# Patient Record
Sex: Male | Born: 1960 | Race: White | Hispanic: No | Smoking: Current every day smoker
Health system: Southern US, Community
[De-identification: ages and names within clinical notes are randomized; demographics above are authoritative.]

## PROBLEM LIST (undated history)

## (undated) DIAGNOSIS — J449 Chronic obstructive pulmonary disease, unspecified: Secondary | ICD-10-CM

## (undated) DIAGNOSIS — M542 Cervicalgia: Secondary | ICD-10-CM

## (undated) DIAGNOSIS — G8929 Other chronic pain: Secondary | ICD-10-CM

## (undated) DIAGNOSIS — M1990 Unspecified osteoarthritis, unspecified site: Secondary | ICD-10-CM

## (undated) DIAGNOSIS — Z981 Arthrodesis status: Secondary | ICD-10-CM

## (undated) DIAGNOSIS — M549 Dorsalgia, unspecified: Secondary | ICD-10-CM

---

## 2008-07-02 HISTORY — PX: NECK SURGERY: SHX720

## 2009-03-02 HISTORY — PX: BACK SURGERY: SHX140

## 2012-10-14 ENCOUNTER — Emergency Department (HOSPITAL_COMMUNITY): Payer: Medicare Other

## 2012-10-14 ENCOUNTER — Encounter (HOSPITAL_COMMUNITY): Payer: Self-pay | Admitting: Emergency Medicine

## 2012-10-14 ENCOUNTER — Emergency Department (HOSPITAL_COMMUNITY)
Admission: EM | Admit: 2012-10-14 | Discharge: 2012-10-14 | Disposition: A | Discharge status: Home / Self Care | Payer: Medicare Other | Attending: Emergency Medicine | Admitting: Emergency Medicine

## 2012-10-14 DIAGNOSIS — Y929 Unspecified place or not applicable: Secondary | ICD-10-CM | POA: Insufficient documentation

## 2012-10-14 DIAGNOSIS — S161XXA Strain of muscle, fascia and tendon at neck level, initial encounter: Secondary | ICD-10-CM

## 2012-10-14 DIAGNOSIS — G8929 Other chronic pain: Secondary | ICD-10-CM

## 2012-10-14 DIAGNOSIS — Z9889 Other specified postprocedural states: Secondary | ICD-10-CM | POA: Insufficient documentation

## 2012-10-14 DIAGNOSIS — F17200 Nicotine dependence, unspecified, uncomplicated: Secondary | ICD-10-CM | POA: Insufficient documentation

## 2012-10-14 DIAGNOSIS — R05 Cough: Secondary | ICD-10-CM | POA: Insufficient documentation

## 2012-10-14 DIAGNOSIS — R059 Cough, unspecified: Secondary | ICD-10-CM | POA: Insufficient documentation

## 2012-10-14 DIAGNOSIS — S139XXA Sprain of joints and ligaments of unspecified parts of neck, initial encounter: Secondary | ICD-10-CM | POA: Insufficient documentation

## 2012-10-14 DIAGNOSIS — Z880 Allergy status to penicillin: Secondary | ICD-10-CM | POA: Insufficient documentation

## 2012-10-14 DIAGNOSIS — Y9389 Activity, other specified: Secondary | ICD-10-CM | POA: Insufficient documentation

## 2012-10-14 DIAGNOSIS — X500XXA Overexertion from strenuous movement or load, initial encounter: Secondary | ICD-10-CM | POA: Insufficient documentation

## 2012-10-14 DIAGNOSIS — S0993XA Unspecified injury of face, initial encounter: Secondary | ICD-10-CM | POA: Insufficient documentation

## 2012-10-14 DIAGNOSIS — K5900 Constipation, unspecified: Secondary | ICD-10-CM | POA: Insufficient documentation

## 2012-10-14 HISTORY — DX: Cervicalgia: M54.2

## 2012-10-14 HISTORY — DX: Other chronic pain: G89.29

## 2012-10-14 HISTORY — DX: Dorsalgia, unspecified: M54.9

## 2012-10-14 MED ORDER — CYCLOBENZAPRINE HCL 10 MG PO TABS
10.0000 mg | ORAL_TABLET | Freq: Once | ORAL | Status: AC
  Administered 2012-10-14: 10 mg via ORAL
  Filled 2012-10-14: qty 1

## 2012-10-14 MED ORDER — NAPROXEN 500 MG PO TABS: 500.0000 mg | ORAL_TABLET | Freq: Two times a day (BID) | ORAL | Status: DC

## 2012-10-14 MED ORDER — IBUPROFEN 800 MG PO TABS
800.0000 mg | ORAL_TABLET | Freq: Once | ORAL | Status: AC
  Administered 2012-10-14: 800 mg via ORAL
  Filled 2012-10-14: qty 1

## 2012-10-14 MED ORDER — OXYCODONE-ACETAMINOPHEN 5-325 MG PO TABS
1.0000 | ORAL_TABLET | Freq: Once | ORAL | Status: AC
  Administered 2012-10-14: 1 via ORAL
  Filled 2012-10-14: qty 1

## 2012-10-14 MED ORDER — CYCLOBENZAPRINE HCL 10 MG PO TABS: 10.0000 mg | ORAL_TABLET | Freq: Two times a day (BID) | ORAL | Status: DC | PRN

## 2012-10-14 NOTE — ED Notes (Signed)
Pt reports having chronic neck and back.  Had surgery July 2011/2012. Moved to [place] 1 year ago and "deals with the pain daily", unable to find a doctor to see him.  Has been having tremors since surgery. Turned head on Thursday and since pain has been severe.

## 2012-10-14 NOTE — ED Provider Notes (Signed)
CSN: [id number]     Arrival date & time 10/14/12  0907 History   First MD Initiated Contact with Patient 10/14/12 1039     Chief Complaint  Patient presents with  . Back Pain  . Neck Pain   (Consider location/radiation/quality/duration/timing/severity/associated sxs/prior Treatment) Patient is a 52 y.o. male presenting with back pain and neck pain. The history is provided by the patient.  Back Pain Quality:  Burning and shooting Radiates to:  R posterior upper leg Pain severity:  Severe Pain is:  Same all the time Onset quality:  Gradual Duration:  5 days Timing:  Constant Progression:  Worsening Chronicity:  Chronic Relieved by:  Nothing Worsened by:  Movement, standing, bending and ambulation Ineffective treatments:  None tried Associated symptoms: no abdominal pain, no chest pain, no dysuria, no fever and no headaches   Neck Pain Associated symptoms: no chest pain, no fever and no headaches   No loss of control of bladder or bowels.  Miguel Gutierrez is a 52 y.o. male who presents to the ED with chronic neck and back pain. He had neck surgery in 2011 and lower back surgery 2012. He moved here from [place] one year ago and has been unable to find a doctor that will accept him. He has been smoking marijuana to help deal with his pain. Last week he turned his neck and felt a pop and had severe pain. He pain has been unbearable since then.   Past Medical History  Diagnosis Date  . Chronic neck pain   . Chronic back pain    Past Surgical History  Procedure Laterality Date  . Back surgery    . Neck surgery     No family history on file. History  Substance Use Topics  . Smoking status: Current Every Day Smoker    Types: Cigarettes  . Smokeless tobacco: Not on file  . Alcohol Use: Yes     Comment: occ    Review of Systems  Constitutional: Negative for fever and chills.  HENT: Negative for congestion and ear pain.   Eyes: Negative for visual disturbance.  Respiratory:  Positive for cough. Negative for shortness of breath.   Cardiovascular: Negative for chest pain and palpitations.  Gastrointestinal: Positive for constipation. Negative for nausea, vomiting and abdominal pain.  Genitourinary: Negative for dysuria, urgency and frequency.  Musculoskeletal: Positive for back pain and neck pain.  Skin: Negative for rash.  Neurological: Negative for dizziness and headaches.  Psychiatric/Behavioral: The patient is not nervous/anxious.     Allergies  Penicillins; Spinach; and Sulfa antibiotics  Home Medications   Current Outpatient Rx  Name  Route  Sig  Dispense  Refill  . oxymetazoline (AFRIN) 0.05 % nasal spray   Nasal   Place 2 sprays into the nose daily as needed for congestion.          BP 103/76  Pulse 88  Temp(Src) 97.7 F (36.5 C) (Oral)  Resp 20  Ht 5\' 11"  (1.803 m)  Wt 150 lb (68.04 kg)  BMI 20.93 kg/m2  SpO2 100% Physical Exam  Nursing note and vitals reviewed. Constitutional: He is oriented to person, place, and time. He appears well-developed and well-nourished.  HENT:  Head: Normocephalic and atraumatic.  Eyes: Conjunctivae and EOM are normal.  Neck: Spinous process tenderness and muscular tenderness present. Decreased range of motion present.  Cardiovascular: Normal rate and regular rhythm.   Pulmonary/Chest: Effort normal.  Abdominal: Soft. There is no tenderness.  Musculoskeletal:  Lumbar back: He exhibits decreased range of motion, tenderness and spasm. He exhibits no deformity and normal pulse.  Neurological: He is alert and oriented to person, place, and time. He has normal strength and normal reflexes. No cranial nerve deficit. Gait normal.  Skin: Skin is warm and dry.  Psychiatric: He has a normal mood and affect. His behavior is normal.   Mr Cervical Spine Wo Contrast  10/14/2012   CLINICAL DATA:  Neck pain and weakness.  EXAM: MRI CERVICAL SPINE WITHOUT CONTRAST  TECHNIQUE: Multiplanar, multisequence MR imaging  was performed. No intravenous contrast was administered.  COMPARISON:  None.  FINDINGS: Moderate degenerative cervical spondylosis with multilevel disk disease and facet disease. Extensive postoperative changes with wide decompressive laminectomies at C3-4, C4-5 and C5-6. Posterior fusion changes are noted without complicating features. The vertebral bodies demonstrate normal marrow signal except for endplate reactive changes. The cervical spinal cord demonstrates a small focus of increased T2 signal intensity at the C4 level which may be related to prior trauma or compression.  C2-3: Mild annular bulge and osteophytic ridging but no significant spinal stenosis. Minimal foraminal encroachment bilaterally, right greater than left.  C3-4:  Moderate artifact. No obvious spinal or foraminal stenosis.  C4-5 posterior fusion changes. No spinal or foraminal stenosis.  C5-6: Postoperative changes. No spinal stenosis. Uncinate spurring changes bilaterally with mild bilateral foraminal stenosis.  C6-7: Broad-based bulging annulus and moderate facet disease but no significant spinal or foraminal stenosis. There is slight flattening of the ventral thecal sac and suspect mild cord signal abnormality possibly reflecting myelomalacia.  C7-T1: Mild bulging annulus. No spinal or foraminal stenosis.  IMPRESSION: 1. Postoperative changes of wide decompressive laminectomies and posterior fusion changes from C3 to C6. 2. Uncinate spurring changes bilaterally at C5-6 with mild bilateral foraminal stenosis. 3. Probable remote cord signal changes at C4. 4. 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 No significant spinal stenosis at this level.   Electronically Signed   By: Loralie Champagne M.D.   On: 10/14/2012 12:51    ED Course: discussed with EDP, will treat patient's pain and have him follow up with ortho or pain management.  Procedures EKG Interpretation   None       MDM  52 y.o. male with chronic  pain. exacerbation of neck pain after turning neck to the side 5 days ago. Will treat for pain  And muscle spasm. Patient to follow up with Dr. Cornell Barman for pain management.  Patient stable for discharge home without any immediate complications. I have reviewed this patient's vital signs, nurses notes, appropriate labs and imaging.  I have discussed findings and plan of care with the patient and he voices understanding.    Medication List    TAKE these medications       cyclobenzaprine 10 MG tablet  Commonly known as:  FLEXERIL  Take 1 tablet (10 mg total) by mouth 2 (two) times daily as needed for muscle spasms.     naproxen 500 MG tablet  Commonly known as:  NAPROSYN  Take 1 tablet (500 mg total) by mouth 2 (two) times daily.      ASK your doctor about these medications       oxymetazoline 0.05 % nasal spray  Commonly known as:  AFRIN  Place 2 sprays into the nose daily as needed for congestion.           Janne Napoleon, [place] 10/15/12 [phone]

## 2012-10-14 NOTE — Progress Notes (Signed)
ED/CM noted patient did not have a PCP listed in the computer.  Patient was given the [facility] with information on the new MDs who are taking new patients, clinics, food pantries, and the handout for new health insurance sign-up.  Patient expressed appreciation for this. He has been unable to see neurologist because he needs a referral and has not had a PCP to give referral.

## 2012-10-16 NOTE — ED Provider Notes (Signed)
Medical screening examination/treatment/procedure(s) were performed by non-physician practitioner and as supervising physician I was immediately available for consultation/collaboration.   Angeliah Wisdom M Prentice Sackrider, DO 10/16/12 2203 

## 2013-05-27 ENCOUNTER — Encounter (INDEPENDENT_AMBULATORY_CARE_PROVIDER_SITE_OTHER): Payer: Self-pay | Admitting: *Deleted

## 2013-06-23 ENCOUNTER — Encounter (INDEPENDENT_AMBULATORY_CARE_PROVIDER_SITE_OTHER): Payer: Self-pay | Admitting: *Deleted

## 2013-06-23 ENCOUNTER — Other Ambulatory Visit (INDEPENDENT_AMBULATORY_CARE_PROVIDER_SITE_OTHER): Payer: Self-pay | Admitting: *Deleted

## 2013-06-23 DIAGNOSIS — Z1211 Encounter for screening for malignant neoplasm of colon: Secondary | ICD-10-CM

## 2013-08-04 ENCOUNTER — Telehealth (INDEPENDENT_AMBULATORY_CARE_PROVIDER_SITE_OTHER): Payer: Self-pay | Admitting: *Deleted

## 2013-08-04 DIAGNOSIS — Z1211 Encounter for screening for malignant neoplasm of colon: Secondary | ICD-10-CM

## 2013-08-04 NOTE — Telephone Encounter (Signed)
Patient needs movi prep 

## 2013-08-06 MED ORDER — PEG-KCL-NACL-NASULF-NA ASC-C 100 G PO SOLR: 1.0000 | Freq: Once | ORAL | Status: DC

## 2013-09-01 ENCOUNTER — Telehealth (INDEPENDENT_AMBULATORY_CARE_PROVIDER_SITE_OTHER): Payer: Self-pay | Admitting: *Deleted

## 2013-09-01 NOTE — Telephone Encounter (Signed)
  Procedure: tcs  Reason/Indication:  screening  Has patient had this procedure before?  no  If so, when, by whom and where?    Is there a family history of colon cancer?  no  Who?  What age when diagnosed?    Is patient diabetic?   no      Does patient have prosthetic heart valve?  no  Do you have a pacemaker?  no  Has patient ever had endocarditis? no  Has patient had joint replacement within last 12 months?  no  Does patient tend to be constipated or take laxatives? occassioanlly  Is patient on Coumadin, Plavix and/or Aspirin? no  Medications: hydrocodone 7.5 mg tid, diazepam 5 mg bid, centrum silver, smokes marijuana  Allergies: pcn, sulfur  Medication Adjustment:   Procedure date & time: 10/01/13 at 830  Pre-op date & time: 09/25/13 at 1245

## 2013-09-01 NOTE — Telephone Encounter (Signed)
agree

## 2013-09-09 ENCOUNTER — Encounter (HOSPITAL_COMMUNITY): Payer: Self-pay | Admitting: Pharmacy Technician

## 2013-09-25 ENCOUNTER — Inpatient Hospital Stay (HOSPITAL_COMMUNITY): Admission: RE | Admit: 2013-09-25 | Payer: PRIVATE HEALTH INSURANCE | Source: Ambulatory Visit

## 2013-10-01 ENCOUNTER — Encounter (HOSPITAL_COMMUNITY): Admission: RE | Payer: Self-pay | Source: Ambulatory Visit

## 2013-10-01 ENCOUNTER — Ambulatory Visit (HOSPITAL_COMMUNITY)
Admission: RE | Admit: 2013-10-01 | Payer: PRIVATE HEALTH INSURANCE | Source: Ambulatory Visit | Admitting: Internal Medicine

## 2013-10-01 SURGERY — COLONOSCOPY WITH PROPOFOL
Anesthesia: Monitor Anesthesia Care

## 2013-12-04 ENCOUNTER — Other Ambulatory Visit (HOSPITAL_COMMUNITY): Payer: Self-pay | Admitting: Family Medicine

## 2013-12-04 ENCOUNTER — Ambulatory Visit (HOSPITAL_COMMUNITY)
Admission: RE | Admit: 2013-12-04 | Discharge: 2013-12-04 | Disposition: A | Discharge status: Home / Self Care | Payer: PRIVATE HEALTH INSURANCE | Source: Ambulatory Visit | Attending: Family Medicine | Admitting: Family Medicine

## 2013-12-04 DIAGNOSIS — F17200 Nicotine dependence, unspecified, uncomplicated: Secondary | ICD-10-CM

## 2013-12-04 DIAGNOSIS — J449 Chronic obstructive pulmonary disease, unspecified: Secondary | ICD-10-CM | POA: Insufficient documentation

## 2013-12-04 DIAGNOSIS — R05 Cough: Secondary | ICD-10-CM | POA: Insufficient documentation

## 2014-01-14 ENCOUNTER — Other Ambulatory Visit (HOSPITAL_COMMUNITY): Payer: Self-pay | Admitting: Neurosurgery

## 2014-01-14 DIAGNOSIS — M5412 Radiculopathy, cervical region: Secondary | ICD-10-CM

## 2014-01-22 ENCOUNTER — Ambulatory Visit (HOSPITAL_COMMUNITY)
Admission: RE | Admit: 2014-01-22 | Discharge: 2014-01-22 | Disposition: A | Discharge status: Home / Self Care | Payer: Medicare Other | Source: Ambulatory Visit | Attending: Neurosurgery | Admitting: Neurosurgery

## 2014-01-22 DIAGNOSIS — M5412 Radiculopathy, cervical region: Secondary | ICD-10-CM | POA: Insufficient documentation

## 2014-01-28 ENCOUNTER — Other Ambulatory Visit: Payer: Self-pay | Admitting: Neurosurgery

## 2014-02-04 ENCOUNTER — Encounter (HOSPITAL_COMMUNITY): Payer: Self-pay

## 2014-02-04 ENCOUNTER — Encounter (HOSPITAL_COMMUNITY)
Admission: RE | Admit: 2014-02-04 | Discharge: 2014-02-04 | Disposition: A | Discharge status: Home / Self Care | Payer: Medicare Other | Source: Ambulatory Visit | Attending: Neurosurgery | Admitting: Neurosurgery

## 2014-02-04 DIAGNOSIS — M5012 Cervical disc disorder with radiculopathy, mid-cervical region: Secondary | ICD-10-CM | POA: Insufficient documentation

## 2014-02-04 DIAGNOSIS — Z01812 Encounter for preprocedural laboratory examination: Secondary | ICD-10-CM | POA: Insufficient documentation

## 2014-02-04 HISTORY — DX: Unspecified osteoarthritis, unspecified site: M19.90

## 2014-02-04 LAB — CBC WITH DIFFERENTIAL/PLATELET
Basophils Absolute: 0.1 10*3/uL (ref 0.0–0.1)
Basophils Relative: 1 % (ref 0–1)
EOS ABS: 0.1 10*3/uL (ref 0.0–0.7)
Eosinophils Relative: 2 % (ref 0–5)
HEMATOCRIT: 45 % (ref 39.0–52.0)
Hemoglobin: 16.1 g/dL (ref 13.0–17.0)
LYMPHS ABS: 1.8 10*3/uL (ref 0.7–4.0)
Lymphocytes Relative: 28 % (ref 12–46)
MCH: 33 pg (ref 26.0–34.0)
MCHC: 35.8 g/dL (ref 30.0–36.0)
MCV: 92.2 fL (ref 78.0–100.0)
Monocytes Absolute: 0.6 10*3/uL (ref 0.1–1.0)
Monocytes Relative: 9 % (ref 3–12)
NEUTROS PCT: 60 % (ref 43–77)
Neutro Abs: 3.9 10*3/uL (ref 1.7–7.7)
Platelets: 225 10*3/uL (ref 150–400)
RBC: 4.88 MIL/uL (ref 4.22–5.81)
RDW: 12.9 % (ref 11.5–15.5)
WBC: 6.4 10*3/uL (ref 4.0–10.5)

## 2014-02-04 LAB — BASIC METABOLIC PANEL
Anion gap: 6 (ref 5–15)
BUN: 8 mg/dL (ref 6–23)
CALCIUM: 9.4 mg/dL (ref 8.4–10.5)
CO2: 28 mmol/L (ref 19–32)
Chloride: 108 mmol/L (ref 96–112)
Creatinine, Ser: 0.97 mg/dL (ref 0.50–1.35)
GFR calc non Af Amer: >90 mL/min (ref >90)
GLUCOSE: 110 mg/dL — AB (ref 70–99)
Potassium: 4.2 mmol/L (ref 3.5–5.1)
SODIUM: 142 mmol/L (ref 135–145)

## 2014-02-04 LAB — SURGICAL PCR SCREEN
MRSA, PCR: NEGATIVE
STAPHYLOCOCCUS AUREUS: NEGATIVE

## 2014-02-04 MED ORDER — DEXAMETHASONE SODIUM PHOSPHATE 10 MG/ML IJ SOLN
10.0000 mg | INTRAMUSCULAR | Status: AC
  Administered 2014-02-05: 10 mg via INTRAVENOUS
  Filled 2014-02-04: qty 1

## 2014-02-04 MED ORDER — VANCOMYCIN HCL IN DEXTROSE 1-5 GM/200ML-% IV SOLN
1000.0000 mg | INTRAVENOUS | Status: AC
  Administered 2014-02-05: 1000 mg via INTRAVENOUS
  Filled 2014-02-04: qty 200

## 2014-02-04 NOTE — Pre-Procedure Instructions (Addendum)
Miguel Gutierrez  02/04/2014   Your procedure is scheduled on:  02/05/14  [facility] short stay admitting at 530 AM.  Call this number if you have problems the morning of surgery: [phone]   Remember:   Do not eat food or drink liquids after midnight.   Take these medicines the morning of surgery with A SIP OF WATER: valium, pain med if needed    STOP all herbel meds, nsaids (aleve,naproxen,advil,ibuprofen) now including vitamins, aspirin   Do not wear jewelry, make-up or nail polish.  Do not wear lotions, powders, or perfumes. You may wear deodorant.  Do not shave 48 hours prior to surgery. Men may shave face and neck.  Do not bring valuables to the hospital.  [facility] is not responsible                  for any belongings or valuables.               Contacts, dentures or bridgework may not be worn into surgery.  Leave suitcase in the car. After surgery it may be brought to your room.  For patients admitted to the hospital, discharge time is determined by your                treatment team.               Patients discharged the day of surgery will not be allowed to drive  home.  Name and phone number of your driver:   Special Instructions:  Special Instructions: [place] - Preparing for Surgery  Before surgery, you can play an important role.  Because skin is not sterile, your skin needs to be as free of germs as possible.  You can reduce the number of germs on you skin by washing with CHG (chlorahexidine gluconate) soap before surgery.  CHG is an antiseptic cleaner which kills germs and bonds with the skin to continue killing germs even after washing.  Please DO NOT use if you have an allergy to CHG or antibacterial soaps.  If your skin becomes reddened/irritated stop using the CHG and inform your nurse when you arrive at Short Stay.  Do not shave (including legs and underarms) for at least 48 hours prior to the first CHG shower.  You may shave your face.  Please  follow these instructions carefully:   1.  Shower with CHG Soap the night before surgery and the morning of Surgery.  2.  If you choose to wash your hair, wash your hair first as usual with your normal shampoo.  3.  After you shampoo, rinse your hair and body thoroughly to remove the Shampoo.  4.  Use CHG as you would any other liquid soap.  You can apply chg directly  to the skin and wash gently with scrungie or a clean washcloth.  5.  Apply the CHG Soap to your body ONLY FROM THE NECK DOWN.  Do not use on open wounds or open sores.  Avoid contact with your eyes ears, mouth and genitals (private parts).  Wash genitals (private parts)       with your normal soap.  6.  Wash thoroughly, paying special attention to the area where your surgery will be performed.  7.  Thoroughly rinse your body with warm water from the neck down.  8.  DO NOT shower/wash with your normal soap after using and rinsing off the CHG Soap.  9.  Pat yourself dry with  a clean towel.            10.  Wear clean pajamas.            11.  Place clean sheets on your bed the night of your first shower and do not sleep with pets.  Day of Surgery  Do not apply any lotions/deodorants the morning of surgery.  Please wear clean clothes to the hospital/surgery center.   Please read over the following fact sheets that you were given: Pain Booklet, Coughing and Deep Breathing, MRSA Information and Surgical Site Infection Prevention

## 2014-02-05 ENCOUNTER — Inpatient Hospital Stay (HOSPITAL_COMMUNITY): Payer: Medicare Other

## 2014-02-05 ENCOUNTER — Inpatient Hospital Stay (HOSPITAL_COMMUNITY): Payer: Medicare Other | Admitting: Critical Care Medicine

## 2014-02-05 ENCOUNTER — Encounter (HOSPITAL_COMMUNITY)
Admission: RE | Disposition: A | Discharge status: Home / Self Care | Payer: Self-pay | Source: Ambulatory Visit | Attending: Neurosurgery

## 2014-02-05 ENCOUNTER — Encounter (HOSPITAL_COMMUNITY): Payer: Self-pay | Admitting: *Deleted

## 2014-02-05 ENCOUNTER — Inpatient Hospital Stay (HOSPITAL_COMMUNITY)
Admission: RE | Admit: 2014-02-05 | Discharge: 2014-02-06 | DRG: 473 | Disposition: A | Discharge status: Home / Self Care | Payer: Medicare Other | Source: Ambulatory Visit | Attending: Neurosurgery | Admitting: Neurosurgery

## 2014-02-05 DIAGNOSIS — Z888 Allergy status to other drugs, medicaments and biological substances status: Secondary | ICD-10-CM | POA: Diagnosis not present

## 2014-02-05 DIAGNOSIS — M5012 Cervical disc disorder with radiculopathy, mid-cervical region: Principal | ICD-10-CM | POA: Diagnosis present

## 2014-02-05 DIAGNOSIS — Z882 Allergy status to sulfonamides status: Secondary | ICD-10-CM

## 2014-02-05 DIAGNOSIS — Z880 Allergy status to penicillin: Secondary | ICD-10-CM | POA: Diagnosis not present

## 2014-02-05 DIAGNOSIS — Z419 Encounter for procedure for purposes other than remedying health state, unspecified: Secondary | ICD-10-CM

## 2014-02-05 DIAGNOSIS — F17210 Nicotine dependence, cigarettes, uncomplicated: Secondary | ICD-10-CM | POA: Diagnosis present

## 2014-02-05 DIAGNOSIS — Z91018 Allergy to other foods: Secondary | ICD-10-CM | POA: Diagnosis not present

## 2014-02-05 DIAGNOSIS — M79601 Pain in right arm: Secondary | ICD-10-CM | POA: Diagnosis present

## 2014-02-05 DIAGNOSIS — M5020 Other cervical disc displacement, unspecified cervical region: Secondary | ICD-10-CM | POA: Diagnosis present

## 2014-02-05 HISTORY — PX: ANTERIOR CERVICAL DECOMP/DISCECTOMY FUSION: SHX1161

## 2014-02-05 SURGERY — ANTERIOR CERVICAL DECOMPRESSION/DISCECTOMY FUSION 1 LEVEL
Anesthesia: General | Site: Spine Cervical

## 2014-02-05 MED ORDER — GLYCOPYRROLATE 0.2 MG/ML IJ SOLN
INTRAMUSCULAR | Status: AC
  Filled 2014-02-05: qty 2

## 2014-02-05 MED ORDER — LIDOCAINE HCL (CARDIAC) 20 MG/ML IV SOLN
INTRAVENOUS | Status: DC | PRN
  Administered 2014-02-05: 60 mg via INTRAVENOUS

## 2014-02-05 MED ORDER — MIDAZOLAM HCL 2 MG/2ML IJ SOLN
INTRAMUSCULAR | Status: AC
  Filled 2014-02-05: qty 2

## 2014-02-05 MED ORDER — MENTHOL 3 MG MT LOZG: 1.0000 | LOZENGE | OROMUCOSAL | Status: DC | PRN

## 2014-02-05 MED ORDER — NEOSTIGMINE METHYLSULFATE 10 MG/10ML IV SOLN
INTRAVENOUS | Status: DC | PRN
  Administered 2014-02-05: 3 mg via INTRAVENOUS

## 2014-02-05 MED ORDER — PROPOFOL 10 MG/ML IV BOLUS
INTRAVENOUS | Status: AC
  Filled 2014-02-05: qty 20

## 2014-02-05 MED ORDER — SENNA 8.6 MG PO TABS
1.0000 | ORAL_TABLET | Freq: Two times a day (BID) | ORAL | Status: DC
  Administered 2014-02-05: 8.6 mg via ORAL
  Filled 2014-02-05 (×2): qty 1

## 2014-02-05 MED ORDER — ACETAMINOPHEN 325 MG PO TABS: 650.0000 mg | ORAL_TABLET | ORAL | Status: DC | PRN

## 2014-02-05 MED ORDER — LACTATED RINGERS IV SOLN
INTRAVENOUS | Status: DC | PRN
  Administered 2014-02-05 (×2): via INTRAVENOUS

## 2014-02-05 MED ORDER — LIDOCAINE HCL (CARDIAC) 20 MG/ML IV SOLN
INTRAVENOUS | Status: AC
  Filled 2014-02-05: qty 5

## 2014-02-05 MED ORDER — ARTIFICIAL TEARS OP OINT
TOPICAL_OINTMENT | OPHTHALMIC | Status: AC
  Filled 2014-02-05: qty 3.5

## 2014-02-05 MED ORDER — PROMETHAZINE HCL 25 MG/ML IJ SOLN: 6.2500 mg | INTRAMUSCULAR | Status: DC | PRN

## 2014-02-05 MED ORDER — SODIUM CHLORIDE 0.9 % IV SOLN: 250.0000 mL | INTRAVENOUS | Status: DC

## 2014-02-05 MED ORDER — ONDANSETRON HCL 4 MG/2ML IJ SOLN: 4.0000 mg | INTRAMUSCULAR | Status: DC | PRN

## 2014-02-05 MED ORDER — BACITRACIN 50000 UNITS IM SOLR
INTRAMUSCULAR | Status: DC | PRN
  Administered 2014-02-05: 500 mL

## 2014-02-05 MED ORDER — PHENOL 1.4 % MT LIQD: 1.0000 | OROMUCOSAL | Status: DC | PRN

## 2014-02-05 MED ORDER — HYDROMORPHONE HCL 1 MG/ML IJ SOLN
0.2500 mg | INTRAMUSCULAR | Status: DC | PRN
  Administered 2014-02-05 (×2): 0.5 mg via INTRAVENOUS

## 2014-02-05 MED ORDER — HYDROCODONE-ACETAMINOPHEN 5-325 MG PO TABS
1.0000 | ORAL_TABLET | ORAL | Status: DC | PRN
  Administered 2014-02-05 – 2014-02-06 (×2): 2 via ORAL
  Filled 2014-02-05 (×2): qty 2

## 2014-02-05 MED ORDER — SODIUM CHLORIDE 0.9 % IJ SOLN
3.0000 mL | Freq: Two times a day (BID) | INTRAMUSCULAR | Status: DC
  Administered 2014-02-05: 3 mL via INTRAVENOUS

## 2014-02-05 MED ORDER — MIDAZOLAM HCL 5 MG/5ML IJ SOLN
INTRAMUSCULAR | Status: DC | PRN
  Administered 2014-02-05: 2 mg via INTRAVENOUS

## 2014-02-05 MED ORDER — ACETAMINOPHEN 650 MG RE SUPP: 650.0000 mg | RECTAL | Status: DC | PRN

## 2014-02-05 MED ORDER — NEOSTIGMINE METHYLSULFATE 10 MG/10ML IV SOLN
INTRAVENOUS | Status: AC
  Filled 2014-02-05: qty 1

## 2014-02-05 MED ORDER — PHENYLEPHRINE HCL 10 MG/ML IJ SOLN
INTRAMUSCULAR | Status: DC | PRN
  Administered 2014-02-05: 80 ug via INTRAVENOUS
  Administered 2014-02-05: 40 ug via INTRAVENOUS

## 2014-02-05 MED ORDER — SODIUM CHLORIDE 0.9 % IJ SOLN: 3.0000 mL | INTRAMUSCULAR | Status: DC | PRN

## 2014-02-05 MED ORDER — PHENYLEPHRINE 40 MCG/ML (10ML) SYRINGE FOR IV PUSH (FOR BLOOD PRESSURE SUPPORT)
PREFILLED_SYRINGE | INTRAVENOUS | Status: AC
  Filled 2014-02-05: qty 10

## 2014-02-05 MED ORDER — PROPOFOL 10 MG/ML IV BOLUS
INTRAVENOUS | Status: DC | PRN
  Administered 2014-02-05: 160 mg via INTRAVENOUS

## 2014-02-05 MED ORDER — FENTANYL CITRATE 0.05 MG/ML IJ SOLN
INTRAMUSCULAR | Status: AC
  Filled 2014-02-05: qty 5

## 2014-02-05 MED ORDER — ONDANSETRON HCL 4 MG/2ML IJ SOLN
INTRAMUSCULAR | Status: DC | PRN
  Administered 2014-02-05: 4 mg via INTRAVENOUS

## 2014-02-05 MED ORDER — ONDANSETRON HCL 4 MG/2ML IJ SOLN
INTRAMUSCULAR | Status: AC
  Filled 2014-02-05: qty 2

## 2014-02-05 MED ORDER — DIAZEPAM 5 MG PO TABS
5.0000 mg | ORAL_TABLET | Freq: Four times a day (QID) | ORAL | Status: DC | PRN
  Administered 2014-02-05: 5 mg via ORAL
  Administered 2014-02-05: 10 mg via ORAL
  Administered 2014-02-06: 5 mg via ORAL
  Filled 2014-02-05 (×2): qty 1
  Filled 2014-02-05: qty 2

## 2014-02-05 MED ORDER — ALUM & MAG HYDROXIDE-SIMETH 200-200-20 MG/5ML PO SUSP: 30.0000 mL | Freq: Four times a day (QID) | ORAL | Status: DC | PRN

## 2014-02-05 MED ORDER — OXYCODONE-ACETAMINOPHEN 5-325 MG PO TABS
1.0000 | ORAL_TABLET | ORAL | Status: DC | PRN
  Administered 2014-02-05 – 2014-02-06 (×3): 2 via ORAL
  Filled 2014-02-05 (×3): qty 2

## 2014-02-05 MED ORDER — ROCURONIUM BROMIDE 50 MG/5ML IV SOLN
INTRAVENOUS | Status: AC
  Filled 2014-02-05: qty 2

## 2014-02-05 MED ORDER — HYDROMORPHONE HCL 1 MG/ML IJ SOLN: 0.5000 mg | INTRAMUSCULAR | Status: DC | PRN

## 2014-02-05 MED ORDER — THROMBIN 5000 UNITS EX SOLR
CUTANEOUS | Status: DC | PRN
  Administered 2014-02-05 (×2): 5000 [IU] via TOPICAL

## 2014-02-05 MED ORDER — 0.9 % SODIUM CHLORIDE (POUR BTL) OPTIME
TOPICAL | Status: DC | PRN
  Administered 2014-02-05: 1000 mL

## 2014-02-05 MED ORDER — FENTANYL CITRATE 0.05 MG/ML IJ SOLN
INTRAMUSCULAR | Status: DC | PRN
  Administered 2014-02-05 (×5): 50 ug via INTRAVENOUS

## 2014-02-05 MED ORDER — GLYCOPYRROLATE 0.2 MG/ML IJ SOLN
INTRAMUSCULAR | Status: DC | PRN
  Administered 2014-02-05: 0.4 mg via INTRAVENOUS

## 2014-02-05 MED ORDER — VANCOMYCIN HCL IN DEXTROSE 1-5 GM/200ML-% IV SOLN
1000.0000 mg | Freq: Once | INTRAVENOUS | Status: AC
  Administered 2014-02-05: 1000 mg via INTRAVENOUS
  Filled 2014-02-05: qty 200

## 2014-02-05 MED ORDER — HEMOSTATIC AGENTS (NO CHARGE) OPTIME
TOPICAL | Status: DC | PRN
  Administered 2014-02-05: 1 via TOPICAL

## 2014-02-05 MED ORDER — ROCURONIUM BROMIDE 100 MG/10ML IV SOLN
INTRAVENOUS | Status: DC | PRN
  Administered 2014-02-05: 50 mg via INTRAVENOUS

## 2014-02-05 MED ORDER — HYDROMORPHONE HCL 1 MG/ML IJ SOLN
INTRAMUSCULAR | Status: AC
  Filled 2014-02-05: qty 1

## 2014-02-05 SURGICAL SUPPLY — 57 items
BAG DECANTER FOR FLEXI CONT (MISCELLANEOUS) ×3 IMPLANT
BENZOIN TINCTURE PRP APPL 2/3 (GAUZE/BANDAGES/DRESSINGS) ×3 IMPLANT
BRUSH SCRUB EZ PLAIN DRY (MISCELLANEOUS) ×3 IMPLANT
BUR MATCHSTICK NEURO 3.0 LAGG (BURR) ×3 IMPLANT
CAGE PEEK 7X14X11 (Cage) ×2 IMPLANT
CANISTER SUCT 3000ML (MISCELLANEOUS) ×3 IMPLANT
CLOSURE WOUND 1/2 X4 (GAUZE/BANDAGES/DRESSINGS) ×1
CONT SPEC 4OZ CLIKSEAL STRL BL (MISCELLANEOUS) ×3 IMPLANT
DRAPE C-ARM 42X72 X-RAY (DRAPES) ×6 IMPLANT
DRAPE LAPAROTOMY 100X72 PEDS (DRAPES) ×3 IMPLANT
DRAPE MICROSCOPE LEICA (MISCELLANEOUS) ×3 IMPLANT
DRAPE POUCH INSTRU U-SHP 10X18 (DRAPES) ×3 IMPLANT
DRILL BIT (BIT) ×3 IMPLANT
DRSG OPSITE POSTOP 4X6 (GAUZE/BANDAGES/DRESSINGS) ×3 IMPLANT
DURAPREP 6ML APPLICATOR 50/CS (WOUND CARE) ×3 IMPLANT
ELECT COATED BLADE 2.86 ST (ELECTRODE) ×3 IMPLANT
ELECT REM PT RETURN 9FT ADLT (ELECTROSURGICAL) ×3
ELECTRODE REM PT RTRN 9FT ADLT (ELECTROSURGICAL) ×1 IMPLANT
GAUZE SPONGE 4X4 12PLY STRL (GAUZE/BANDAGES/DRESSINGS) IMPLANT
GAUZE SPONGE 4X4 16PLY XRAY LF (GAUZE/BANDAGES/DRESSINGS) IMPLANT
GLOVE BIOGEL PI IND STRL 7.0 (GLOVE) ×4 IMPLANT
GLOVE BIOGEL PI INDICATOR 7.0 (GLOVE) ×8
GLOVE ECLIPSE 6.5 STRL STRAW (GLOVE) ×9 IMPLANT
GLOVE ECLIPSE 9.0 STRL (GLOVE) ×3 IMPLANT
GLOVE EXAM NITRILE LRG STRL (GLOVE) IMPLANT
GLOVE EXAM NITRILE MD LF STRL (GLOVE) IMPLANT
GLOVE EXAM NITRILE XL STR (GLOVE) IMPLANT
GLOVE EXAM NITRILE XS STR PU (GLOVE) IMPLANT
GOWN STRL REUS W/ TWL LRG LVL3 (GOWN DISPOSABLE) ×2 IMPLANT
GOWN STRL REUS W/ TWL XL LVL3 (GOWN DISPOSABLE) ×1 IMPLANT
GOWN STRL REUS W/TWL 2XL LVL3 (GOWN DISPOSABLE) IMPLANT
GOWN STRL REUS W/TWL LRG LVL3 (GOWN DISPOSABLE) ×4
GOWN STRL REUS W/TWL XL LVL3 (GOWN DISPOSABLE) ×2
HALTER HD/CHIN CERV TRACTION D (MISCELLANEOUS) ×3 IMPLANT
KIT BASIN OR (CUSTOM PROCEDURE TRAY) ×3 IMPLANT
KIT ROOM TURNOVER OR (KITS) ×3 IMPLANT
NEEDLE SPNL 20GX3.5 QUINCKE YW (NEEDLE) ×3 IMPLANT
NS IRRIG 1000ML POUR BTL (IV SOLUTION) ×3 IMPLANT
PACK LAMINECTOMY NEURO (CUSTOM PROCEDURE TRAY) ×3 IMPLANT
PAD ARMBOARD 7.5X6 YLW CONV (MISCELLANEOUS) ×9 IMPLANT
PLATE ELITE VISION 25MM (Plate) ×3 IMPLANT
RUBBERBAND STERILE (MISCELLANEOUS) ×6 IMPLANT
SCREW ST 15X4XST FXANG NS (Screw) ×4 IMPLANT
SCREW ST FIX 4 ATL (Screw) ×8 IMPLANT
SPACER SPNL 11X14X7XPEEK CVD (Cage) ×1 IMPLANT
SPCR SPNL 11X14X7XPEEK CVD (Cage) ×1 IMPLANT
SPONGE INTESTINAL PEANUT (DISPOSABLE) ×3 IMPLANT
SPONGE SURGIFOAM ABS GEL SZ50 (HEMOSTASIS) ×3 IMPLANT
STRIP CLOSURE SKIN 1/2X4 (GAUZE/BANDAGES/DRESSINGS) ×2 IMPLANT
SUT PDS AB 5-0 P3 18 (SUTURE) ×3 IMPLANT
SUT VIC AB 3-0 SH 8-18 (SUTURE) ×3 IMPLANT
SYR 20ML ECCENTRIC (SYRINGE) ×3 IMPLANT
TAPE CLOTH 4X10 WHT NS (GAUZE/BANDAGES/DRESSINGS) IMPLANT
TOWEL OR 17X24 6PK STRL BLUE (TOWEL DISPOSABLE) ×3 IMPLANT
TOWEL OR 17X26 10 PK STRL BLUE (TOWEL DISPOSABLE) ×3 IMPLANT
TRAP SPECIMEN MUCOUS 40CC (MISCELLANEOUS) ×3 IMPLANT
WATER STERILE IRR 1000ML POUR (IV SOLUTION) ×3 IMPLANT

## 2014-02-05 NOTE — Anesthesia Postprocedure Evaluation (Signed)
  Anesthesia Post-op Note  Patient: Miguel Gutierrez  Procedure(s) Performed: Procedure(s): ANTERIOR CERVICAL DECOMPRESSION/DISCECTOMY FUSION CERVICAL SIX-SEVEN (N/A)  Patient Location: PACU  Anesthesia Type:General  Level of Consciousness: awake  Airway and Oxygen Therapy: Patient Spontanous Breathing  Post-op Pain: mild  Post-op Assessment: Post-op Vital signs reviewed  Post-op Vital Signs: Reviewed  Last Vitals:  Filed Vitals:   02/05/14 1035  BP: 136/87  Pulse: 87  Temp: 37 C  Resp: 18    Complications: No apparent anesthesia complications

## 2014-02-05 NOTE — Anesthesia Preprocedure Evaluation (Addendum)
Anesthesia Evaluation  Patient identified by MRN, date of birth, ID band Patient awake    Reviewed: Allergy & Precautions, NPO status , Patient's Chart, lab work & pertinent test results  Airway Mallampati: I  TM Distance: >3 FB Neck ROM: Full    Dental  (+) Dental Advisory Given, Missing, Poor Dentition   Pulmonary Current Smoker,          Cardiovascular     Neuro/Psych    GI/Hepatic   Endo/Other    Renal/GU      Musculoskeletal  (+) Arthritis -,   Abdominal   Peds  Hematology   Anesthesia Other Findings   Reproductive/Obstetrics                            Anesthesia Physical Anesthesia Plan  ASA: II  Anesthesia Plan: General   Post-op Pain Management:    Induction: Intravenous  Airway Management Planned: Oral ETT  Additional Equipment:   Intra-op Plan:   Post-operative Plan: Extubation in OR  Informed Consent: I have reviewed the patients History and Physical, chart, labs and discussed the procedure including the risks, benefits and alternatives for the proposed anesthesia with the patient or authorized representative who has indicated his/her understanding and acceptance.   Dental advisory given  Plan Discussed with: Anesthesiologist and Surgeon  Anesthesia Plan Comments:         Anesthesia Quick Evaluation

## 2014-02-05 NOTE — H&P (Signed)
Miguel Gutierrez is an 54 y.o. male.   Chief Complaint: right arm pain HPI: 54 yo sp c3-6 post cervical fusion presents with right UE pain and weakness cw Right c7 radiculopathy.  Right 6/7 hnp on mri . Plan c67 acdf  Past Medical History  Diagnosis Date  . Chronic neck pain   . Chronic back pain   . Arthritis     Past Surgical History  Procedure Laterality Date  . Neck surgery  7/10  . Back surgery  3/11    History reviewed. No pertinent family history. Social History:  reports that he has been smoking Cigarettes.  He has a 40 pack-year smoking history. He does not have any smokeless tobacco history on file. He reports that he drinks alcohol. He reports that he uses illicit drugs (Marijuana).  Allergies:  Allergies  Allergen Reactions  . Penicillins Anaphylaxis  . Spinach Anaphylaxis  . Sulfa Antibiotics Anaphylaxis  . Chantix [Varenicline] Nausea Only    nightmares    Medications Prior to Admission  Medication Sig Dispense Refill  . cholecalciferol (VITAMIN D) 1000 UNITS tablet Take 2,000 Units by mouth daily.    . diazepam (VALIUM) 5 MG tablet Take 5 mg by mouth every 6 (six) hours as needed for anxiety.    Marland Kitchen HYDROcodone-acetaminophen (VICODIN ES) 7.5-750 MG per tablet Take 1 tablet by mouth every 6 (six) hours as needed for pain.    . peg 3350 powder (MOVIPREP) 100 G SOLR Take 1 kit (200 g total) by mouth once. (Patient not taking: Reported on 01/30/2014) 1 kit 0    Results for orders placed or performed during the hospital encounter of 02/04/14 (from the past 48 hour(s))  Basic metabolic panel     Status: Abnormal   Collection Time: 02/04/14 10:24 AM  Result Value Ref Range   Sodium 142 135 - 145 mmol/L   Potassium 4.2 3.5 - 5.1 mmol/L   Chloride 108 96 - 112 mmol/L   CO2 28 19 - 32 mmol/L   Glucose, Bld 110 (H) 70 - 99 mg/dL   BUN 8 6 - 23 mg/dL   Creatinine, Ser 0.97 0.50 - 1.35 mg/dL   Calcium 9.4 8.4 - 10.5 mg/dL   GFR calc non Af Amer >90 >90 mL/min   GFR calc  Af Amer >90 >90 mL/min    Comment: (NOTE) The eGFR has been calculated using the CKD EPI equation. This calculation has not been validated in all clinical situations. eGFR's persistently <90 mL/min signify possible Chronic Kidney Disease.    Anion gap 6 5 - 15  CBC WITH DIFFERENTIAL     Status: None   Collection Time: 02/04/14 10:24 AM  Result Value Ref Range   WBC 6.4 4.0 - 10.5 K/uL   RBC 4.88 4.22 - 5.81 MIL/uL   Hemoglobin 16.1 13.0 - 17.0 g/dL   HCT 45.0 39.0 - 52.0 %   MCV 92.2 78.0 - 100.0 fL   MCH 33.0 26.0 - 34.0 pg   MCHC 35.8 30.0 - 36.0 g/dL   RDW 12.9 11.5 - 15.5 %   Platelets 225 150 - 400 K/uL   Neutrophils Relative % 60 43 - 77 %   Neutro Abs 3.9 1.7 - 7.7 K/uL   Lymphocytes Relative 28 12 - 46 %   Lymphs Abs 1.8 0.7 - 4.0 K/uL   Monocytes Relative 9 3 - 12 %   Monocytes Absolute 0.6 0.1 - 1.0 K/uL   Eosinophils Relative 2 0 - 5 %  Eosinophils Absolute 0.1 0.0 - 0.7 K/uL   Basophils Relative 1 0 - 1 %   Basophils Absolute 0.1 0.0 - 0.1 K/uL  Surgical pcr screen     Status: None   Collection Time: 02/04/14 10:24 AM  Result Value Ref Range   MRSA, PCR NEGATIVE NEGATIVE   Staphylococcus aureus NEGATIVE NEGATIVE    Comment:        The Xpert SA Assay (FDA approved for NASAL specimens in patients over 14 years of age), is one component of a comprehensive surveillance program.  Test performance has been validated by [facility] for patients greater than or equal to 3 year old. It is not intended to diagnose infection nor to guide or monitor treatment.    No results found.  Review of Systems  All other systems reviewed and are negative.   Blood pressure 114/78, pulse 64, temperature 97.7 F (36.5 C), temperature source Oral, resp. rate 18, weight 61.916 kg (136 lb 8 oz), SpO2 98 %. Physical Exam  Constitutional: He is oriented to person, place, and time. He appears well-developed and well-nourished. No distress.  HENT:  Head: Normocephalic and  atraumatic.  Right Ear: External ear normal.  Left Ear: External ear normal.  Nose: Nose normal.  Mouth/Throat: Oropharynx is clear and moist.  Eyes: Conjunctivae and EOM are normal. Pupils are equal, round, and reactive to light. Right eye exhibits no discharge. Left eye exhibits no discharge.  Neck: Normal range of motion. No tracheal deviation present. No thyromegaly present.  Cardiovascular: Normal rate, regular rhythm, normal heart sounds and intact distal pulses.   Respiratory: Effort normal and breath sounds normal. No stridor. No respiratory distress. He has no wheezes.  GI: Soft. Bowel sounds are normal. He exhibits no distension. There is no tenderness.  Musculoskeletal: Normal range of motion. He exhibits no edema or tenderness.  Neurological: He is alert and oriented to person, place, and time. He has normal reflexes. He displays normal reflexes. No cranial nerve deficit. He exhibits normal muscle tone. Coordination normal.  Right triceps 4-/5  Skin: Skin is warm and dry. No rash noted. He is not diaphoretic. No erythema. No pallor.  Psychiatric: He has a normal mood and affect. His behavior is normal. Judgment and thought content normal.     Assessment/Plan Right 67 HNP with radiculopathy.  Plan c67 ACDF.  Risks and benefits explained and patient wishes to proceed.  Dava Rensch A 02/05/2014, 7:23 AM

## 2014-02-05 NOTE — Anesthesia Procedure Notes (Signed)
Procedure Name: Intubation Date/Time: 02/05/2014 7:55 AM Performed by: Elon AlasLEE, Larissa Pegg BROWN Pre-anesthesia Checklist: Timeout performed, Patient identified, Emergency Drugs available, Suction available and Patient being monitored Patient Re-evaluated:Patient Re-evaluated prior to inductionOxygen Delivery Method: Circle system utilized Preoxygenation: Pre-oxygenation with 100% oxygen Intubation Type: IV induction Ventilation: Mask ventilation without difficulty Grade View: Grade I Tube type: Oral Tube size: 7.5 mm Number of attempts: 1 Airway Equipment and Method: Stylet and Video-laryngoscopy Placement Confirmation: CO2 detector,  positive ETCO2,  ETT inserted through vocal cords under direct vision and breath sounds checked- equal and bilateral Secured at: 23 cm Tube secured with: Tape Dental Injury: Teeth and Oropharynx as per pre-operative assessment  Comments: Elective glidescope intubation. Pt has previous posterior cervical fusion with limited mobility and experiences severe pain with small motion.

## 2014-02-05 NOTE — Op Note (Signed)
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  Surgeon:Betti Goodenow A.Ameira Alessandrini, M.D.  Asst. Surgeon: None  Anesthesia: General  Indication: 54 year old male status post previous C3-C6 posterior cervical decompression infusion presents now with neck and right upper extremity pain paresthesias and weakness consistent with a right-sided C7 radiculopathy. Patient has failed conservative management. MRI scan demonstrates evidence of an acute right-sided C6-7 disc herniation with marked compression of the right C7 nerve root.  Operative note: After induction of anesthesia, patient position supine with neck slightly Extended held place of halter traction. Anterior cervical region prepped and draped. Incision made overlying C6-7. Dissection down to the level of the platysma. Platysma divided vertically. Dissection along the medial border of the sternum quite a muscle muscle and carotid sheath. Trachea and esophagus mobilized retracted towards the left. Longus colli muscles all dated. Retractor placed. X-ray taken. Level confirmed. Disc space and size. Discectomy performed using pituitary rongeurs, Karlin curettes, Kerrison rongeurs and the high-speed drill. All elements the disc removed down to follow posterior annulus. Microscope brought into the field use throughout the remainder of the discectomy. Remaining aspects of annulus and osteophytes removed using high-speed drill down to the level of the posterior longitudinal ligament. Posterior longitudinal limb was then elevated and resected piecemeal fashion using Kerrison rongeurs. Underlying thecal sac identified. Wide central decompression performed by  undercutting the bodies of C6 and C7. Decompression then proceeded out into each neural foramen. Wide anterior foraminotomies performed on the course exiting C7 nerve root. All elements of the right word C6-7 disc herniation were resected. At this point a very thorough decompression had been achieved. Wound was then irrigated. A 7 mm Medtronic anatomic peek cage packed with locally harvested autograft was impacted into place and recessed slightly from the anterior cortical margin. 25 mm Atlantis anterior cervical plate was then placed over the C6 and C7 levels. This an attachment or fluoroscopic guidance using 15 mm fixed angle screws 2 each at both levels per all 4 screws were given a final tightening found be solidly within the bone. Locking screws engaged both levels. Final images revealed good position of the bone graft and hardware at proper upper level with normal lamina spine. Wounds and irrigated one final time. Hemostasis was assured with cautery. Wounds and close in layers. Steri-Strips triggers were applied. No apparent complications. Patient tolerated procedure well and he returns to the recovery room postop.

## 2014-02-05 NOTE — Transfer of Care (Signed)
Immediate Anesthesia Transfer of Care Note  Patient: Miguel SpecterWalter Gutierrez  Procedure(s) Performed: Procedure(s): ANTERIOR CERVICAL DECOMPRESSION/DISCECTOMY FUSION CERVICAL SIX-SEVEN (N/A)  Patient Location: PACU  Anesthesia Type:General  Level of Consciousness: awake, alert  and oriented  Airway & Oxygen Therapy: Patient Spontanous Breathing and Patient connected to nasal cannula oxygen  Post-op Assessment: Report given to RN and Post -op Vital signs reviewed and stable  Post vital signs: Reviewed and stable  Last Vitals:  Filed Vitals:   02/05/14 0627  BP: 114/78  Pulse: 64  Temp: 36.5 C  Resp: 18    Complications: No apparent anesthesia complications

## 2014-02-05 NOTE — Brief Op Note (Signed)
02/05/2014  9:03 AM  PATIENT:  Miguel Gutierrez  54 y.o. male  PRE-OPERATIVE DIAGNOSIS:  stenosis  POST-OPERATIVE DIAGNOSIS:  stenosis  PROCEDURE:  Procedure(s): ANTERIOR CERVICAL DECOMPRESSION/DISCECTOMY FUSION CERVICAL SIX-SEVEN (N/A)  SURGEON:  Surgeon(s) and Role:    * Temple PaciniHenry A Rashauna Tep, MD - Primary  PHYSICIAN ASSISTANT:   ASSISTANTS:    ANESTHESIA:   general  EBL:  Total I/O In: 1000 [I.V.:1000] Out: 25 [Blood:25]  BLOOD ADMINISTERED:none  DRAINS: none   LOCAL MEDICATIONS USED:  NONE  SPECIMEN:  No Specimen  DISPOSITION OF SPECIMEN:  N/A  COUNTS:  YES  TOURNIQUET:  * No tourniquets in log *  DICTATION: .Dragon Dictation  PLAN OF CARE: Admit to inpatient   PATIENT DISPOSITION:  PACU - hemodynamically stable.   Delay start of Pharmacological VTE agent (>24hrs) due to surgical blood loss or risk of bleeding: yes

## 2014-02-06 MED ORDER — HYDROCODONE-ACETAMINOPHEN 5-325 MG PO TABS: 1.0000 | ORAL_TABLET | ORAL | Status: DC | PRN

## 2014-02-06 MED ORDER — DIAZEPAM 5 MG PO TABS: 5.0000 mg | ORAL_TABLET | Freq: Four times a day (QID) | ORAL | Status: DC | PRN

## 2014-02-06 NOTE — Progress Notes (Signed)
Pt given D/C instructions with Rx's, verbal understanding was provided. Pt's IV was removed prior to D/C. Pt received RW prior to D/C. Pt D/C'd home via wheelchair @ 1110 per MD order. Pt is stable @ D/C and has no other needs at this time. Rema FendtAshley Yezenia Fredrick, RN

## 2014-02-06 NOTE — Discharge Summary (Signed)
Physician Discharge Summary  Patient ID: Miguel Gutierrez MRN: [id number] DOB/AGE: September 03, 1960 54 y.o.  Admit date: 02/05/2014 Discharge date: 02/06/2014  Admission Diagnoses:  Discharge Diagnoses:  Active Problems:   Cervical herniated disc   Discharged Condition: good  Hospital Course: The patient is admitted to the hospital where he underwent an uncompensated C6-7 anterior cervical discectomy and fusion. Postoperative use and very well. Neck and upper extremity pain much improved. Patient is up ambulating without difficulty. Ready for discharge home.  Consults:   Significant Diagnostic Studies:   Treatments:   Discharge Exam: Blood pressure 107/84, pulse 70, temperature 97.6 F (36.4 C), temperature source Oral, resp. rate 16, weight 61.916 kg (136 lb 8 oz), SpO2 98 %. Awake and alert. Oriented and appropriate. Motor and sensory function stable. Wound clean and dry. Chest and abdomen benign.  Disposition: 01-Home or Self Care     Medication List    STOP taking these medications        HYDROcodone-acetaminophen 7.5-750 MG per tablet  Commonly known as:  VICODIN ES  Replaced by:  HYDROcodone-acetaminophen 5-325 MG per tablet      TAKE these medications        cholecalciferol 1000 UNITS tablet  Commonly known as:  VITAMIN D  Take 2,000 Units by mouth daily.     diazepam 5 MG tablet  Commonly known as:  VALIUM  Take 1-2 tablets (5-10 mg total) by mouth every 6 (six) hours as needed for muscle spasms.     diazepam 5 MG tablet  Commonly known as:  VALIUM  Take 5 mg by mouth every 6 (six) hours as needed for anxiety.     HYDROcodone-acetaminophen 5-325 MG per tablet  Commonly known as:  NORCO/VICODIN  Take 1-2 tablets by mouth every 4 (four) hours as needed for moderate pain.     peg 3350 powder 100 G Solr  Commonly known as:  MOVIPREP  Take 1 kit (200 g total) by mouth once.           Follow-up Information    Follow up with Charlie Pitter, MD.   Specialty:   Neurosurgery   Contact information:   [address] [place] [address] [phone]       Signed: Charlie Pitter 02/06/2014, 9:00 AM

## 2014-02-06 NOTE — Discharge Instructions (Signed)

## 2014-02-09 ENCOUNTER — Encounter (HOSPITAL_COMMUNITY): Payer: Self-pay | Admitting: Neurosurgery

## 2014-06-26 NOTE — Pre-Procedure Instructions (Signed)
Zidaan Wonders  06/26/2014      [id number] - [place], [place] - [address] SEC OF [address] [place] [address] Phone: [phone] Fax: [phone]    Your procedure is scheduled on Friday, July 1st.   Report to [facility] Admitting at 8:15 AM  Call this number if you have problems the morning of surgery:  [phone]   Remember:  Do not eat food or drink liquids after midnight Thursday.  Take these medicines the morning of surgery with A SIP OF WATER : Valium, Hydrocodone   Do not wear jewelry - no rings or watches.  Do not wear lotions or colognes.   You may NOT wear deodorant the day of surgery.             Men may shave face and neck.   Do not bring valuables to the hospital.  [facility] is not responsible for any belongings or valuables. Contacts, dentures or bridgework may not be worn into surgery.  Leave your suitcase in the car.  After surgery it may be brought to your room. For patients admitted to the hospital, discharge time will be determined by your treatment team.  Patients discharged the day of surgery will not be allowed to drive home.   Name and phone number of your driver:    Special instructions:  "Preparing for Surgery" instruction sheet.  Please read over the following fact sheets that you were given. Pain Booklet, Coughing and Deep Breathing and Surgical Site Infection Prevention

## 2014-06-29 ENCOUNTER — Encounter (HOSPITAL_COMMUNITY)
Admission: RE | Admit: 2014-06-29 | Discharge: 2014-06-29 | Disposition: A | Discharge status: Home / Self Care | Payer: Medicare Other | Source: Ambulatory Visit | Attending: Oral Surgery | Admitting: Oral Surgery

## 2014-06-29 ENCOUNTER — Encounter (HOSPITAL_COMMUNITY): Payer: Self-pay

## 2014-06-29 DIAGNOSIS — Z01812 Encounter for preprocedural laboratory examination: Secondary | ICD-10-CM | POA: Diagnosis present

## 2014-06-29 DIAGNOSIS — K089 Disorder of teeth and supporting structures, unspecified: Secondary | ICD-10-CM | POA: Insufficient documentation

## 2014-06-29 LAB — CBC
HEMATOCRIT: 47.8 % (ref 39.0–52.0)
Hemoglobin: 16.6 g/dL (ref 13.0–17.0)
MCH: 32 pg (ref 26.0–34.0)
MCHC: 34.7 g/dL (ref 30.0–36.0)
MCV: 92.3 fL (ref 78.0–100.0)
Platelets: 237 10*3/uL (ref 150–400)
RBC: 5.18 MIL/uL (ref 4.22–5.81)
RDW: 12.7 % (ref 11.5–15.5)
WBC: 5.4 10*3/uL (ref 4.0–10.5)

## 2014-06-30 NOTE — H&P (Signed)
HISTORY AND PHYSICAL  Miguel Gutierrez is a 54 y.o. male patient with CC: opening from mouth to sinus on right.  HPI: Patient underwent extraction 04/06/14 by general dentist. Had closure of oral antral communication at time of surgery. Pt continued to smoke 2ppd and area didn't heal. Now with drainage from nose when drinking, pain.  No diagnosis found.  Past Medical History  Diagnosis Date  . Chronic neck pain   . Chronic back pain   . Arthritis     No current facility-administered medications for this encounter.   Current Outpatient Prescriptions  Medication Sig Dispense Refill  . cholecalciferol (VITAMIN D) 1000 UNITS tablet Take 2,000 Units by mouth daily.    . diazepam (VALIUM) 5 MG tablet Take 1-2 tablets (5-10 mg total) by mouth every 6 (six) hours as needed for muscle spasms. 60 tablet 0  . HYDROcodone-acetaminophen (NORCO/VICODIN) 5-325 MG per tablet Take 1-2 tablets by mouth every 4 (four) hours as needed for moderate pain. 80 tablet 0   Allergies  Allergen Reactions  . Penicillins Anaphylaxis  . Spinach Anaphylaxis  . Sulfa Antibiotics Anaphylaxis  . Chantix [Varenicline] Nausea Only    nightmares   Active Problems:   * No active hospital problems. *  Vitals: There were no vitals taken for this visit. Lab results:No results found for this or any previous visit (from the past 24 hour(s)). Radiology Results: No results found. General appearance: alert, cooperative and no distress Head: Normocephalic, without obvious abnormality, atraumatic Eyes: negative Nose: Nares normal. Septum midline. Mucosa normal. No drainage or sinus tenderness. Throat: 1cm x 1cm oral antral communication right posterior maxilla. Pharynx clear. No trismus.  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  Plan: Closure oral antral fistula right maxilla. Caldwell luc right maxillary sinusotomy. General anesthesia. Day surgery.   Miguel Gutierrez,Miguel Gutierrez 06/30/2014

## 2014-07-02 MED ORDER — CLINDAMYCIN PHOSPHATE 600 MG/50ML IV SOLN
600.0000 mg | INTRAVENOUS | Status: DC
  Filled 2014-07-02: qty 50

## 2014-07-03 ENCOUNTER — Ambulatory Visit (HOSPITAL_COMMUNITY): Payer: Medicare Other | Admitting: Certified Registered Nurse Anesthetist

## 2014-07-03 ENCOUNTER — Encounter (HOSPITAL_COMMUNITY)
Admission: RE | Disposition: A | Discharge status: Home / Self Care | Payer: Self-pay | Source: Ambulatory Visit | Attending: Oral Surgery

## 2014-07-03 ENCOUNTER — Encounter (HOSPITAL_COMMUNITY): Payer: Self-pay | Admitting: *Deleted

## 2014-07-03 ENCOUNTER — Ambulatory Visit (HOSPITAL_COMMUNITY)
Admission: RE | Admit: 2014-07-03 | Discharge: 2014-07-03 | Disposition: A | Discharge status: Home / Self Care | Payer: Medicare Other | Source: Ambulatory Visit | Attending: Oral Surgery | Admitting: Oral Surgery

## 2014-07-03 DIAGNOSIS — Z880 Allergy status to penicillin: Secondary | ICD-10-CM | POA: Insufficient documentation

## 2014-07-03 DIAGNOSIS — J320 Chronic maxillary sinusitis: Secondary | ICD-10-CM | POA: Insufficient documentation

## 2014-07-03 DIAGNOSIS — F17210 Nicotine dependence, cigarettes, uncomplicated: Secondary | ICD-10-CM | POA: Diagnosis not present

## 2014-07-03 DIAGNOSIS — Z882 Allergy status to sulfonamides status: Secondary | ICD-10-CM | POA: Diagnosis not present

## 2014-07-03 DIAGNOSIS — M549 Dorsalgia, unspecified: Secondary | ICD-10-CM | POA: Insufficient documentation

## 2014-07-03 DIAGNOSIS — G8929 Other chronic pain: Secondary | ICD-10-CM | POA: Insufficient documentation

## 2014-07-03 DIAGNOSIS — Z888 Allergy status to other drugs, medicaments and biological substances status: Secondary | ICD-10-CM | POA: Diagnosis not present

## 2014-07-03 DIAGNOSIS — M1990 Unspecified osteoarthritis, unspecified site: Secondary | ICD-10-CM | POA: Diagnosis not present

## 2014-07-03 DIAGNOSIS — M542 Cervicalgia: Secondary | ICD-10-CM | POA: Insufficient documentation

## 2014-07-03 HISTORY — PX: CALDWELL LUC SINUSOTOMY: SHX6292

## 2014-07-03 SURGERY — CALDWELL LUC SINUSOTOMY
Anesthesia: General | Site: Mouth

## 2014-07-03 MED ORDER — FENTANYL CITRATE (PF) 100 MCG/2ML IJ SOLN: 25.0000 ug | INTRAMUSCULAR | Status: DC | PRN

## 2014-07-03 MED ORDER — LORATADINE-PSEUDOEPHEDRINE ER 10-240 MG PO TB24: 1.0000 | ORAL_TABLET | Freq: Every day | ORAL | Status: DC

## 2014-07-03 MED ORDER — OXYMETAZOLINE HCL 0.05 % NA SOLN: 1.0000 | Freq: Two times a day (BID) | NASAL | Status: DC

## 2014-07-03 MED ORDER — FENTANYL CITRATE (PF) 250 MCG/5ML IJ SOLN
INTRAMUSCULAR | Status: AC
  Filled 2014-07-03: qty 5

## 2014-07-03 MED ORDER — PROPOFOL 10 MG/ML IV BOLUS
INTRAVENOUS | Status: DC | PRN
  Administered 2014-07-03: 200 mg via INTRAVENOUS

## 2014-07-03 MED ORDER — OXYCODONE-ACETAMINOPHEN 5-325 MG PO TABS: 1.0000 | ORAL_TABLET | ORAL | Status: DC | PRN

## 2014-07-03 MED ORDER — MIDAZOLAM HCL 2 MG/2ML IJ SOLN
INTRAMUSCULAR | Status: AC
  Filled 2014-07-03: qty 2

## 2014-07-03 MED ORDER — OXYCODONE HCL 5 MG PO TABS: 5.0000 mg | ORAL_TABLET | Freq: Once | ORAL | Status: DC | PRN

## 2014-07-03 MED ORDER — FENTANYL CITRATE (PF) 100 MCG/2ML IJ SOLN
INTRAMUSCULAR | Status: DC | PRN
  Administered 2014-07-03 (×2): 50 ug via INTRAVENOUS
  Administered 2014-07-03: 100 ug via INTRAVENOUS
  Administered 2014-07-03: 50 ug via INTRAVENOUS

## 2014-07-03 MED ORDER — OXYMETAZOLINE HCL 0.05 % NA SOLN
NASAL | Status: DC | PRN
  Administered 2014-07-03: 1

## 2014-07-03 MED ORDER — MEPERIDINE HCL 25 MG/ML IJ SOLN
INTRAMUSCULAR | Status: AC
  Filled 2014-07-03: qty 1

## 2014-07-03 MED ORDER — LIDOCAINE-EPINEPHRINE 2 %-1:100000 IJ SOLN
INTRAMUSCULAR | Status: DC | PRN
  Administered 2014-07-03: 11 mL via INTRADERMAL

## 2014-07-03 MED ORDER — SUCCINYLCHOLINE CHLORIDE 20 MG/ML IJ SOLN
INTRAMUSCULAR | Status: DC | PRN
  Administered 2014-07-03: 120 mg via INTRAVENOUS

## 2014-07-03 MED ORDER — ONDANSETRON HCL 4 MG/2ML IJ SOLN: 4.0000 mg | Freq: Once | INTRAMUSCULAR | Status: DC | PRN

## 2014-07-03 MED ORDER — METRONIDAZOLE 500 MG PO TABS: 500.0000 mg | ORAL_TABLET | Freq: Three times a day (TID) | ORAL | Status: DC

## 2014-07-03 MED ORDER — DEXAMETHASONE SODIUM PHOSPHATE 10 MG/ML IJ SOLN
INTRAMUSCULAR | Status: DC | PRN
  Administered 2014-07-03: 10 mg via INTRAVENOUS

## 2014-07-03 MED ORDER — ONDANSETRON HCL 4 MG/2ML IJ SOLN
INTRAMUSCULAR | Status: DC | PRN
  Administered 2014-07-03: 4 mg via INTRAVENOUS

## 2014-07-03 MED ORDER — 0.9 % SODIUM CHLORIDE (POUR BTL) OPTIME
TOPICAL | Status: DC | PRN
  Administered 2014-07-03: 1000 mL

## 2014-07-03 MED ORDER — VANCOMYCIN HCL IN DEXTROSE 1-5 GM/200ML-% IV SOLN
1000.0000 mg | INTRAVENOUS | Status: AC
  Administered 2014-07-03: 1000 mg via INTRAVENOUS

## 2014-07-03 MED ORDER — PROPOFOL 10 MG/ML IV BOLUS
INTRAVENOUS | Status: AC
  Filled 2014-07-03: qty 20

## 2014-07-03 MED ORDER — VANCOMYCIN HCL 1000 MG IV SOLR: 1000.0000 mg | Freq: Once | INTRAVENOUS | Status: DC

## 2014-07-03 MED ORDER — VANCOMYCIN HCL IN DEXTROSE 1-5 GM/200ML-% IV SOLN
INTRAVENOUS | Status: AC
  Filled 2014-07-03: qty 200

## 2014-07-03 MED ORDER — MIDAZOLAM HCL 5 MG/5ML IJ SOLN
INTRAMUSCULAR | Status: DC | PRN
  Administered 2014-07-03: 2 mg via INTRAVENOUS

## 2014-07-03 MED ORDER — LIDOCAINE HCL (CARDIAC) 20 MG/ML IV SOLN
INTRAVENOUS | Status: DC | PRN
  Administered 2014-07-03: 50 mg via INTRAVENOUS

## 2014-07-03 MED ORDER — OXYCODONE HCL 5 MG/5ML PO SOLN: 5.0000 mg | Freq: Once | ORAL | Status: DC | PRN

## 2014-07-03 MED ORDER — LACTATED RINGERS IV SOLN
INTRAVENOUS | Status: DC
  Administered 2014-07-03: 09:00:00 via INTRAVENOUS

## 2014-07-03 SURGICAL SUPPLY — 51 items
BIT DRILL UPPR FCE 0.9M 4 TWST (BIT) ×1 IMPLANT
BLADE 10 SAFETY STRL DISP (BLADE) ×3 IMPLANT
BLADE SURG 15 STRL LF DISP TIS (BLADE) ×1 IMPLANT
BLADE SURG 15 STRL SS (BLADE) ×2
BUR CROSS CUT (BURR) ×6
BUR CROSS CUT FISSURE 1.6 (BURR) ×2 IMPLANT
BUR CROSS CUT FISSURE 1.6MM (BURR) ×1
BUR SRG MED 1.6XXCUT FSSR (BURR) ×2 IMPLANT
BUR SRG MED 2.1XXCUT FSSR (BURR) ×1 IMPLANT
BURR SRG MED 1.6XXCUT FSSR (BURR) ×2
BURR SRG MED 2.1XXCUT FSSR (BURR) ×1
CANISTER SUCTION 2500CC (MISCELLANEOUS) ×3 IMPLANT
COVER SURGICAL LIGHT HANDLE (MISCELLANEOUS) ×3 IMPLANT
DRILL UPPERFACE 0.9M 4MM TWIST (BIT) ×3
GAUZE PACKING FOLDED 2  STR (GAUZE/BANDAGES/DRESSINGS) ×2
GAUZE PACKING FOLDED 2 STR (GAUZE/BANDAGES/DRESSINGS) ×1 IMPLANT
GAUZE SPONGE 4X4 16PLY XRAY LF (GAUZE/BANDAGES/DRESSINGS) ×3 IMPLANT
GLOVE BIO SURGEON STRL SZ 6.5 (GLOVE) ×4 IMPLANT
GLOVE BIO SURGEON STRL SZ7.5 (GLOVE) ×6 IMPLANT
GLOVE BIO SURGEONS STRL SZ 6.5 (GLOVE) ×2
GLOVE BIOGEL PI IND STRL 6.5 (GLOVE) ×1 IMPLANT
GLOVE BIOGEL PI IND STRL 7.0 (GLOVE) ×1 IMPLANT
GLOVE BIOGEL PI INDICATOR 6.5 (GLOVE) ×2
GLOVE BIOGEL PI INDICATOR 7.0 (GLOVE) ×2
GLOVE SURG SS PI 6.5 STRL IVOR (GLOVE) ×3 IMPLANT
GOWN STRL REUS W/ TWL LRG LVL3 (GOWN DISPOSABLE) ×2 IMPLANT
GOWN STRL REUS W/ TWL XL LVL3 (GOWN DISPOSABLE) ×1 IMPLANT
GOWN STRL REUS W/TWL LRG LVL3 (GOWN DISPOSABLE) ×4
GOWN STRL REUS W/TWL XL LVL3 (GOWN DISPOSABLE) ×2
KIT BASIN OR (CUSTOM PROCEDURE TRAY) ×3 IMPLANT
KIT ROOM TURNOVER OR (KITS) ×3 IMPLANT
NEEDLE 22X1 1/2 (OR ONLY) (NEEDLE) ×6 IMPLANT
NEEDLE BLUNT 16X1.5 OR ONLY (NEEDLE) IMPLANT
NS IRRIG 1000ML POUR BTL (IV SOLUTION) ×3 IMPLANT
PAD ARMBOARD 7.5X6 YLW CONV (MISCELLANEOUS) ×6 IMPLANT
PLATE UPPER FACE 7H Y (Plate) ×3 IMPLANT
SCREW UPPERFACE 1.2X5M SL (Screw) ×3 IMPLANT
SCREW UPPERFACE1.2X3M SLFDRILL (Screw) ×12 IMPLANT
SUT CHROMIC 3 0 PS 2 (SUTURE) ×6 IMPLANT
SUT VIC AB 3-0 PS2 18 (SUTURE) ×4
SUT VIC AB 3-0 PS2 18XBRD (SUTURE) ×2 IMPLANT
SYR 50ML SLIP (SYRINGE) IMPLANT
SYR CONTROL 10ML LL (SYRINGE) ×3 IMPLANT
TOWEL OR 17X24 6PK STRL BLUE (TOWEL DISPOSABLE) ×3 IMPLANT
TOWEL OR 17X26 10 PK STRL BLUE (TOWEL DISPOSABLE) ×3 IMPLANT
TRAY ENT MC OR (CUSTOM PROCEDURE TRAY) ×3 IMPLANT
TUBE CONNECTING 12'X1/4 (SUCTIONS) ×1
TUBE CONNECTING 12X1/4 (SUCTIONS) ×2 IMPLANT
TUBING IRRIGATION (MISCELLANEOUS) ×3 IMPLANT
WATER STERILE IRR 1000ML POUR (IV SOLUTION) ×3 IMPLANT
YANKAUER SUCT BULB TIP NO VENT (SUCTIONS) ×3 IMPLANT

## 2014-07-03 NOTE — Progress Notes (Signed)
Pt states he had a reaction to a po antibiotic, but is not sure what med it was, states it might have been cleocin. Dr Barbette MerinoJensen informed and new orders noted.

## 2014-07-03 NOTE — Op Note (Signed)
07/03/2014  11:30 AM  PATIENT:  Miguel Gutierrez  54 y.o. male  PRE-OPERATIVE DIAGNOSIS: Right maxillary oral-antral fistula; right maxillary sinusitis  POST-OPERATIVE DIAGNOSIS:  SAME  PROCEDURE:  Procedure(s): Closure Right maxillary oral-antral fistula via bone graft, buccal pedical fat graft, and sliding palatal finger flap; RIGHT CALDWELL LUC SINUSOTOMY; Insertion custom oral surgery splint  SURGEON:  Surgeon(s): Ocie DoyneScott Athena Baltz, DDS  ANESTHESIA:   local and general  EBL:  minimal  DRAINS: none   SPECIMEN:  No Specimen  COUNTS:  YES  PLAN OF CARE: Discharge to home after PACU  PATIENT DISPOSITION:  PACU - hemodynamically stable.   PROCEDURE DETAILS: Dictation # [id number]  Georgia LopesScott M. Jonah Nestle, DMD 07/03/2014 11:30 AM

## 2014-07-03 NOTE — Progress Notes (Signed)
[facility] pharmacy called and asked about what meds he may have had lately, pharmacy states they have no record of any antibiotics filled there in the past few years.

## 2014-07-03 NOTE — Anesthesia Preprocedure Evaluation (Signed)
Anesthesia Evaluation  Patient identified by MRN, date of birth, ID band Patient awake    Reviewed: Allergy & Precautions, NPO status , Patient's Chart, lab work & pertinent test results  Airway Mallampati: II  TM Distance: >3 FB Neck ROM: Full    Dental  (+) Poor Dentition   Pulmonary Current Smoker,  breath sounds clear to auscultation        Cardiovascular Rhythm:Regular Rate:Normal     Neuro/Psych    GI/Hepatic   Endo/Other    Renal/GU      Musculoskeletal   Abdominal   Peds  Hematology   Anesthesia Other Findings   Reproductive/Obstetrics                             Anesthesia Physical Anesthesia Plan  ASA: III  Anesthesia Plan: General   Post-op Pain Management:    Induction: Intravenous  Airway Management Planned:   Additional Equipment:   Intra-op Plan:   Post-operative Plan:   Informed Consent: I have reviewed the patients History and Physical, chart, labs and discussed the procedure including the risks, benefits and alternatives for the proposed anesthesia with the patient or authorized representative who has indicated his/her understanding and acceptance.   Dental advisory given  Plan Discussed with: CRNA and Anesthesiologist  Anesthesia Plan Comments:         Anesthesia Quick Evaluation

## 2014-07-03 NOTE — Transfer of Care (Signed)
Immediate Anesthesia Transfer of Care Note  Patient: Miguel Gutierrez  Procedure(s) Performed: Procedure(s): CALDWELL LUC SINUSOTOMY (N/A)  Patient Location: PACU  Anesthesia Type:General  Level of Consciousness: awake, alert , oriented and patient cooperative  Airway & Oxygen Therapy: Patient Spontanous Breathing and Patient connected to nasal cannula oxygen  Post-op Assessment: Report given to RN, Post -op Vital signs reviewed and stable, Patient moving all extremities and Patient moving all extremities X 4  Post vital signs: Reviewed and stable  Last Vitals:  Filed Vitals:   07/03/14 0820  BP: 95/66  Pulse: 69  Temp: 36.2 C  Resp: 20    Complications: No apparent anesthesia complications

## 2014-07-03 NOTE — H&P (Signed)
H&P documentation  -History and Physical Reviewed  -Patient has been re-examined  -No change in the plan of care  Miguel Gutierrez  

## 2014-07-03 NOTE — Anesthesia Postprocedure Evaluation (Signed)
  Anesthesia Post-op Note  Patient: Miguel Gutierrez  Procedure(s) Performed: Procedure(s): CALDWELL LUC SINUSOTOMY (N/A)  Patient Location: PACU  Anesthesia Type:General  Level of Consciousness: awake, alert  and oriented  Airway and Oxygen Therapy: Patient Spontanous Breathing  Post-op Pain: mild  Post-op Assessment: Post-op Vital signs reviewed, Patient's Cardiovascular Status Stable, Respiratory Function Stable, Patent Airway and Pain level controlled              Post-op Vital Signs: stable  Last Vitals:  Filed Vitals:   07/03/14 1216  BP: 143/80  Pulse: 93  Temp:   Resp: 16    Complications: No apparent anesthesia complications

## 2014-07-03 NOTE — Anesthesia Procedure Notes (Signed)
Procedure Name: Intubation Date/Time: 07/03/2014 10:30 AM Performed by: Adonis HousekeeperNGELL, Calianna Kim M Pre-anesthesia Checklist: Patient identified, Emergency Drugs available, Suction available and Patient being monitored Patient Re-evaluated:Patient Re-evaluated prior to inductionOxygen Delivery Method: Circle system utilized Preoxygenation: Pre-oxygenation with 100% oxygen Intubation Type: IV induction Ventilation: Mask ventilation without difficulty and Oral airway inserted - appropriate to patient size Laryngoscope Size: Mac and 4 Grade View: Grade I Nasal Tubes: Nasal Rae Tube size: 7.5 mm Number of attempts: 1 Airway Equipment and Method: Stylet and Bougie stylet Placement Confirmation: ETT inserted through vocal cords under direct vision,  positive ETCO2 and breath sounds checked- equal and bilateral Tube secured with: Tape Dental Injury: Teeth and Oropharynx as per pre-operative assessment

## 2014-07-04 NOTE — Op Note (Signed)
NAME:  Miguel Gutierrez, Miguel Gutierrez                ACCOUNT NO.:  [id number]  MEDICAL RECORD NO.:  [id number]  LOCATION:  [facility]                         FACILITY:  [facility]  PHYSICIAN:  Georgia LopesScott M. Kaitlinn Iversen, M.D.  DATE OF BIRTH:  Aug 15, 1960  DATE OF PROCEDURE:  07/03/2014 DATE OF DISCHARGE:  07/03/2014                              OPERATIVE REPORT   PREOPERATIVE DIAGNOSES: 1. Right maxillary oroantral fistula. 2. Right maxillary sinusitis.  POSTOPERATIVE DIAGNOSES: 1. Right maxillary oroantral fistula. 2. Right maxillary sinusitis.  PROCEDURE:  Closure of right maxillary oroantral fistula via bone graft, buccal pedicle fat graft, and sliding palatal finger graft, right Caldwell-Luc maxillary sinusotomy, insertion of custom oral surgery splint.  SURGEON:  Georgia LopesScott M. Yasiel Goyne, M.D.  ANESTHESIA:  General.  Guadalupe Mapleavid C. Joslin, M.D., attending.  Nasal intubation.  INDICATIONS FOR PROCEDURE:  The patient had undergone removal of a tooth sometime in the past by general dentist and was found to have a persistent oroantral communication because of bone loss in the right maxilla.  This was repaired by another oral surgeon, however, the repair broke down, and the patient presented to my office with recurrent oral fistula.  Because of the failure of the first surgery, it was recommended that sinusotomy be performed to debride infected sinus tissue and to close the oroantral fistula.  DESCRIPTION OF PROCEDURE:  The patient was taken to the operating room, placed on the table in supine position.  General anesthesia was administered intravenously and nasal endotracheal tube was placed.  The eyes were protected and the patient was draped for the procedure.  Time- out was performed.  The posterior pharynx was suctioned and throat pack was placed.  Lidocaine 2% with 1:100,000 epinephrine was infiltrated in the right buccal vestibule and posterior portion of the maxilla as well as the palatal aspects, total of 10 mL was  utilized.  A #15 blade was used to make an incision around the fistulous opening.  It appeared to be about 1 cm, but as the tissue was removed, there was approximately a 2.5 cm x 1.5 cm area devoid of bone in the right maxilla along the alveolar ridge.  Dissection was carried down until healthy bone tissue was visible on all sides of the fistulous opening.  The tissue was discarded that was removed.  Then a Caldwell-Luc incision was made with a 15 blade in the maxillary buccal vestibule above the attached gingiva. Dissection was carried down to the bone with a 15 blade.  Then, the periosteum was reflected superiorly until the inferior orbital nerve was identified.  Then, a Stryker handpiece was used to remove a window of bone approximately 1.5 x 1.5 cm at the anterior maxillary wall.  Then, the sinus was encountered and there was some mild inflammation.  No polyps were noted and overall, the tissue was much healthier than expected.  The inflamed tissue was debrided near the fistula and then the sinus was irrigated.  The bone that was taken from the anterior maxillary window was then fitted over the anterior portion of the bony opening on the alveolar ridge.  Then, a buccal pedicle fat graft was harvested using 15 blade and hemostats and pickups to gently  tease the fat pad out from its pocket in the right posterior maxilla.  Then, this graft was left and the attention was turned to the palatal finger flap. A 15 blade was used to make approximately 3 cm x 1.5 cm finger flap along the right the periphery of the palate with the posterior base and anterior pedicle.  The flap was partial thickness, so that there was some healthy tissue remaining and then the flap was loosened with a 15 blade.  A small back cut was made at the posterior portion medially and then the flap was rotated, so that it could be closed over the fistulous area without attention.  Then, the bone was placed over the opening  in the anterior region and was affixed to the buccal bone using a double wide bone plate with micro screws.  Then, the buccal pedicle graft was flapped overlying this and sutured to the medial palatal tissue using 4- 0 Vicryl.  Then, the finger flap was sutured to the tissue overlying the buccal palatal pedicle graft with 4-0 Vicryl as well.  Then, the Caldwell-Luc incision was closed with 3-0 chromic horizontal mattress sutures.  Then, previously fabricated oral surgical splint was inserted into the mouth and denture liner was placed to help ensure good contact in the splint and the tissues on the right side.  This was to aid in prevention of oral fluids or debride to enter into the surgical site. The patient was then irrigated and suctioned.  Throat pack was removed. The patient was awakened, taken to the recovery room, breathing spontaneously, in stable condition.  ESTIMATED BLOOD LOSS:  Minimal.  COMPLICATIONS:  None.  SPECIMENS:  None.     Georgia Lopes, M.D.     SMJ/MEDQ  D:  07/03/2014  T:  07/04/2014  Job:  [id number]

## 2014-07-07 ENCOUNTER — Encounter (HOSPITAL_COMMUNITY): Payer: Self-pay | Admitting: Oral Surgery

## 2017-02-13 ENCOUNTER — Encounter (HOSPITAL_COMMUNITY): Payer: Self-pay | Admitting: Emergency Medicine

## 2017-02-13 ENCOUNTER — Emergency Department (HOSPITAL_COMMUNITY): Payer: Medicare Other

## 2017-02-13 ENCOUNTER — Other Ambulatory Visit: Payer: Self-pay

## 2017-02-13 ENCOUNTER — Emergency Department (HOSPITAL_COMMUNITY)
Admission: EM | Admit: 2017-02-13 | Discharge: 2017-02-13 | Disposition: A | Discharge status: Home / Self Care | Payer: Medicare Other | Attending: Emergency Medicine | Admitting: Emergency Medicine

## 2017-02-13 DIAGNOSIS — F17210 Nicotine dependence, cigarettes, uncomplicated: Secondary | ICD-10-CM | POA: Insufficient documentation

## 2017-02-13 DIAGNOSIS — G8929 Other chronic pain: Secondary | ICD-10-CM | POA: Insufficient documentation

## 2017-02-13 DIAGNOSIS — M5441 Lumbago with sciatica, right side: Secondary | ICD-10-CM | POA: Diagnosis not present

## 2017-02-13 DIAGNOSIS — Z79899 Other long term (current) drug therapy: Secondary | ICD-10-CM | POA: Insufficient documentation

## 2017-02-13 DIAGNOSIS — M5442 Lumbago with sciatica, left side: Secondary | ICD-10-CM | POA: Insufficient documentation

## 2017-02-13 DIAGNOSIS — M545 Low back pain: Secondary | ICD-10-CM | POA: Diagnosis present

## 2017-02-13 MED ORDER — HYDROMORPHONE HCL 1 MG/ML IJ SOLN
1.0000 mg | Freq: Once | INTRAMUSCULAR | Status: AC
  Administered 2017-02-13: 1 mg via INTRAMUSCULAR
  Filled 2017-02-13: qty 1

## 2017-02-13 MED ORDER — OXYCODONE-ACETAMINOPHEN 5-325 MG PO TABS: 1.0000 | ORAL_TABLET | ORAL | 0 refills | Status: DC | PRN

## 2017-02-13 MED ORDER — KETOROLAC TROMETHAMINE 30 MG/ML IJ SOLN
30.0000 mg | Freq: Once | INTRAMUSCULAR | Status: AC
  Administered 2017-02-13: 30 mg via INTRAMUSCULAR
  Filled 2017-02-13: qty 1

## 2017-02-13 MED ORDER — FENTANYL CITRATE (PF) 100 MCG/2ML IJ SOLN
100.0000 ug | Freq: Once | INTRAMUSCULAR | Status: AC
  Administered 2017-02-13: 100 ug via INTRAMUSCULAR
  Filled 2017-02-13: qty 2

## 2017-02-13 NOTE — ED Notes (Signed)
Pt in MRI, unable to obtain pain reassessment at this time.

## 2017-02-13 NOTE — ED Provider Notes (Signed)
[facility] EMERGENCY DEPARTMENT Provider Note   CSN: [id number] Arrival date & time: 02/13/17  1118     History   Chief Complaint Chief Complaint  Patient presents with  . Back Pain    HPI Miguel Gutierrez is a 57 y.o. 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 Sudie Bailey and Dr Dutch Quint for this condition.  He reports simply ambulating in his kitchen on February 2 when he felt a sudden popping sensation in his lower back and since his event he has had gradually worsening pain with sciatica into his bilateral legs, left greater than right.  He also has intermittent episodes where his legs will buckle secondary to pain and weakness.  However he does endorse this is a chronic condition but is more frequent since this episode occurred on the second.  He denies perineal numbness nor is he had urinary or fecal incontinence or retention.  He has been unable to ambulate for the past 24 hours secondary to pain and weakness.  He had a friend lent him a wheelchair so that he can get around his home, at baseline he uses a cane or a walker for ambulation.  He is chronically on hydrocodone and Valium for pain and muscle spasm.  He typically takes his medication twice daily on most days, but has had to double up his medication for the past 10 days.  He contacted his PCP and was directed here for further evaluation.   The history is provided by the patient.    Past Medical History:  Diagnosis Date  . Arthritis   . Chronic back pain   . Chronic neck pain     Patient Active Problem List   Diagnosis Date Noted  . Cervical herniated disc 02/05/2014    Past Surgical History:  Procedure Laterality Date  . ANTERIOR CERVICAL DECOMP/DISCECTOMY FUSION N/A 02/05/2014   Procedure: ANTERIOR CERVICAL DECOMPRESSION/DISCECTOMY FUSION CERVICAL SIX-SEVEN;  Surgeon: Temple Pacini,  MD;  Location: [facility];  Service: Neurosurgery;  Laterality: N/A;  . BACK SURGERY  3/11  . CALDWELL LUC SINUSOTOMY N/A 07/03/2014   Procedure: CALDWELL LUC SINUSOTOMY;  Surgeon: Ocie Doyne, DDS;  Location: [facility] OR;  Service: Oral Surgery;  Laterality: N/A;  . NECK SURGERY  7/10       Home Medications    Prior to Admission medications   Medication Sig Start Date End Date Taking? Authorizing Provider  cholecalciferol (VITAMIN D) 1000 UNITS tablet Take 2,000 Units by mouth daily.    [provider]  diazepam (VALIUM) 5 MG tablet Take 1-2 tablets (5-10 mg total) by mouth every 6 (six) hours as needed for muscle spasms. 02/06/14   Julio Sicks, MD  loratadine-pseudoephedrine (CLARITIN-D 24 HOUR) 10-240 MG per 24 hr tablet Take 1 tablet by mouth daily. 07/03/14   Ocie Doyne, DDS  metroNIDAZOLE (FLAGYL) 500 MG tablet Take 1 tablet (500 mg total) by mouth 3 (three) times daily. 07/03/14   Ocie Doyne, DDS  oxyCODONE-acetaminophen (PERCOCET/ROXICET) 5-325 MG tablet Take 1 tablet by mouth every 4 (four) hours as needed. 02/13/17   Burgess Amor, PA-C  oxymetazoline (AFRIN NASAL SPRAY) 0.05 % nasal spray Place 1 spray into both nostrils 2 (two) times daily. 07/03/14   Ocie Doyne, DDS    Family History Family History  Problem Relation Age of Onset  . Cancer Mother  Social History Social History   Tobacco Use  . Smoking status: Current Every Day Smoker    Packs/day: 1.50    Years: 40.00    Pack years: 60.00    Types: Cigarettes  . Smokeless tobacco: Never Used  . Tobacco comment: using electronic cigarettes currently  Substance Use Topics  . Alcohol use: Yes    Comment: occ none in 3 months  . Drug use: Yes    Frequency: 14.0 times per week    Types: Marijuana    Comment: uses almost every day to help with pain control     Allergies   Penicillins; Spinach; Sulfa antibiotics; and Chantix [varenicline]   Review of Systems Review of Systems  Constitutional: Negative  for fever.  Musculoskeletal: Positive for back pain. Negative for arthralgias, joint swelling and myalgias.  Neurological: Negative for weakness and numbness.     Physical Exam Updated Vital Signs BP 106/78   Pulse 70   Temp 97.7 F (36.5 C) (Oral)   Resp 18   Ht 5\' 10"  (1.778 m)   Wt 62.1 kg (137 lb)   SpO2 96%   BMI 19.66 kg/m   Physical Exam  Constitutional: He appears well-developed and well-nourished.  HENT:  Head: Normocephalic.  Eyes: Conjunctivae are normal.  Neck: Normal range of motion. Neck supple.  Cardiovascular: Normal rate and intact distal pulses.  Pedal pulses normal.  Pulmonary/Chest: Effort normal.  Abdominal: Soft. Bowel sounds are normal. He exhibits no distension and no mass.  Musculoskeletal: Normal range of motion. He exhibits no edema.       Lumbar back: He exhibits tenderness and bony tenderness. He exhibits no swelling, no edema and no spasm.  Neurological: He is alert. He has normal strength. He displays no atrophy and no tremor. No sensory deficit. Gait normal.  Reflex Scores:      Patellar reflexes are 2+ on the right side and 2+ on the left side. Bilateral ankle flex/ext intact, but strength 4/5 right, 5/5 left. No foot drop. SLR positive left leg.  Skin: Skin is warm and dry.  Psychiatric: He has a normal mood and affect.  Nursing note and vitals reviewed.    ED Treatments / Results  Labs (all labs ordered are listed, but only abnormal results are displayed) Labs Reviewed - No data to display  EKG  EKG Interpretation None       Radiology Mr Lumbar Spine Wo Contrast  Result Date: 02/13/2017 CLINICAL DATA:  Back pain with progressive neuro deficit EXAM: MRI LUMBAR SPINE WITHOUT CONTRAST TECHNIQUE: Multiplanar, multisequence MR imaging of the lumbar spine was performed. No intravenous contrast was administered. COMPARISON:  MRI 02/16/2016, 07/02/2014 FINDINGS: Segmentation:  Normal Alignment: Mild retrolisthesis L4-5 unchanged. Mild  anterolisthesis L5-S1 unchanged Vertebrae: Schmorl's nodes throughout the lumbar spine related to disc degeneration. Mild bone marrow edema around the Schmorl's node superior endplate of L3, unchanged from the prior study. Improvement in edema around the Schmorl's node inferior endplate of L3. Negative for fracture or mass Conus medullaris and cauda equina: Conus extends to the T12-L1 level. Conus and cauda equina appear normal. Paraspinal and other soft tissues: Negative for mass or adenopathy or fluid collection Markedly distended urinary bladder. Disc levels: T12-L1: Mild disc degeneration L1-2: Moderate disc degeneration. Small left paracentral disc protrusion similar to the prior study. Small upgoing fragment of disc on the left unchanged. Mild subarticular stenosis on the left L2-3: Moderate disc degeneration, left greater than right. Endplate spurring on the left with subarticular stenosis  on the left unchanged. Mild spinal stenosis unchanged. L3-4: Disc degeneration with diffuse disc bulging and endplate spurring. Bilateral facet hypertrophy. Mild subarticular stenosis bilaterally. Mild foraminal narrowing bilaterally. No change from the prior study. Mild narrowing of the canal without significant spinal stenosis L4-5: 5 mm retrolisthesis unchanged. Progression of broad-based central disc protrusion. Moderate facet hypertrophy bilaterally. Moderate to severe subarticular stenosis bilaterally with progression. Moderate spinal stenosis has progressed. L5-S1: 4 mm anterolisthesis with advanced disc degeneration. Bilateral pedicle screw fusion interbody spacer on the right unchanged from the prior study. No significant foraminal encroachment. IMPRESSION: Small left-sided disc protrusion L1-2 unchanged causing mild subarticular stenosis. Mild spinal stenosis L2-3 with left subarticular stenosis unchanged Mild subarticular and foraminal stenosis bilaterally L3-4 unchanged. Progressive disc and facet degeneration  L4-5. Progressive disc degeneration. Moderate spinal stenosis with moderate to severe subarticular stenosis bilaterally has progressed. Fusion L5-S1 without stenosis or interval change. Distended urinary bladder. Electronically Signed   By: Marlan Palau M.D.   On: 02/13/2017 18:05    Procedures Procedures (including critical care time)  Medications Ordered in ED Medications  HYDROmorphone (DILAUDID) injection 1 mg (1 mg Intramuscular Given 02/13/17 1711)  ketorolac (TORADOL) 30 MG/ML injection 30 mg (30 mg Intramuscular Given 02/13/17 1846)  fentaNYL (SUBLIMAZE) injection 100 mcg (100 mcg Intramuscular Given 02/13/17 1846)     Initial Impression / Assessment and Plan / ED Course  I have reviewed the triage vital signs and the nursing notes.  Pertinent labs & imaging results that were available during my care of the patient were reviewed by me and considered in my medical decision making (see chart for details).     Patient was given Dilaudid IM with reduction in pain from 10/10-6/10.  Added fentanyl and Toradol IM with additional pain relief to 2 out of 10.  He ambulated in the department, slowly using a walker but he was stable and had no foot drop, no weakness with weightbearing.  He felt at his baseline and ready to return home.  He expressed concern over his few remaining hydrocodone, concerned he will need to continue with increased doses.  He was given a small supply of oxycodone to supplement his remaining pain medication.  He was advised to contact his PCP and also discussed strict return precautions.  Patient endorses that his last conversation with Dr. Dutch Quint resulted in recommendation of additional lumbar surgery.  Patient has been reluctant undergo this, but is planning to contact Dr. Dutch Quint at this time.  He has no neurologic deficits on today's exam suggesting emergent need for neurosurgical intervention.  Discussed steroid taper, patient stating these have not been terribly helpful  in the past.  Will defer at this time.  [place] controlled substance database reviewed. Matches pt's hx.  Final Clinical Impressions(s) / ED Diagnoses   Final diagnoses:  Chronic bilateral low back pain with bilateral sciatica    ED Discharge Orders        Ordered    oxyCODONE-acetaminophen (PERCOCET/ROXICET) 5-325 MG tablet  Every 4 hours PRN     02/13/17 1944       Victoriano Lain 02/13/17 1945    Burgess Amor, PA-C 02/13/17 1946    Loren Racer, MD 02/13/17 2234

## 2017-02-13 NOTE — ED Notes (Signed)
While waiting for room pt requested to be moved out of WC. Two RN's at side while [facility] in waiting room.

## 2017-02-13 NOTE — ED Notes (Signed)
Pt ambulated with use of walker to nurse's station without difficulty.

## 2017-02-13 NOTE — ED Triage Notes (Signed)
Patient reports low back pain with radiation into his legs. Patient unable to stand, not able to transfer to w/c in Triage. Patient reports multiple surgeries with instrumentation.

## 2017-02-13 NOTE — Discharge Instructions (Signed)
You may use the medicine prescribed for pain relief, do not drive within 4 hours of taking this medicine.  Call your primary doctor as discussed.

## 2017-04-19 ENCOUNTER — Ambulatory Visit (HOSPITAL_COMMUNITY)
Admission: RE | Admit: 2017-04-19 | Discharge: 2017-04-19 | Disposition: A | Discharge status: Home / Self Care | Payer: Medicare Other | Source: Ambulatory Visit | Attending: Family Medicine | Admitting: Family Medicine

## 2017-04-19 ENCOUNTER — Other Ambulatory Visit (HOSPITAL_COMMUNITY): Payer: Self-pay | Admitting: Family Medicine

## 2017-04-19 DIAGNOSIS — M25552 Pain in left hip: Secondary | ICD-10-CM | POA: Diagnosis present

## 2017-04-19 DIAGNOSIS — M1612 Unilateral primary osteoarthritis, left hip: Secondary | ICD-10-CM | POA: Diagnosis not present

## 2017-04-23 ENCOUNTER — Other Ambulatory Visit (HOSPITAL_COMMUNITY): Payer: Self-pay | Admitting: Family Medicine

## 2017-04-23 DIAGNOSIS — M1612 Unilateral primary osteoarthritis, left hip: Secondary | ICD-10-CM

## 2017-04-26 ENCOUNTER — Ambulatory Visit (HOSPITAL_COMMUNITY)
Admission: RE | Admit: 2017-04-26 | Discharge: 2017-04-26 | Disposition: A | Discharge status: Home / Self Care | Payer: Medicare Other | Source: Ambulatory Visit | Attending: Family Medicine | Admitting: Family Medicine

## 2017-04-26 DIAGNOSIS — M1612 Unilateral primary osteoarthritis, left hip: Secondary | ICD-10-CM | POA: Diagnosis present

## 2017-04-26 DIAGNOSIS — M948X8 Other specified disorders of cartilage, other site: Secondary | ICD-10-CM | POA: Insufficient documentation

## 2019-02-08 IMAGING — DX DG HIP (WITH OR WITHOUT PELVIS) 2-3V*L*
3 series · 3 of 3 positions shown · non-contrast
Comparison: MRI 02/13/2017.  Lumbar spine 01/15/2013.

CLINICAL DATA: Left hip pain.  Recent falls.

EXAM:
DG HIP (WITH OR WITHOUT PELVIS) 2-3V LEFT

[pelvis ap]
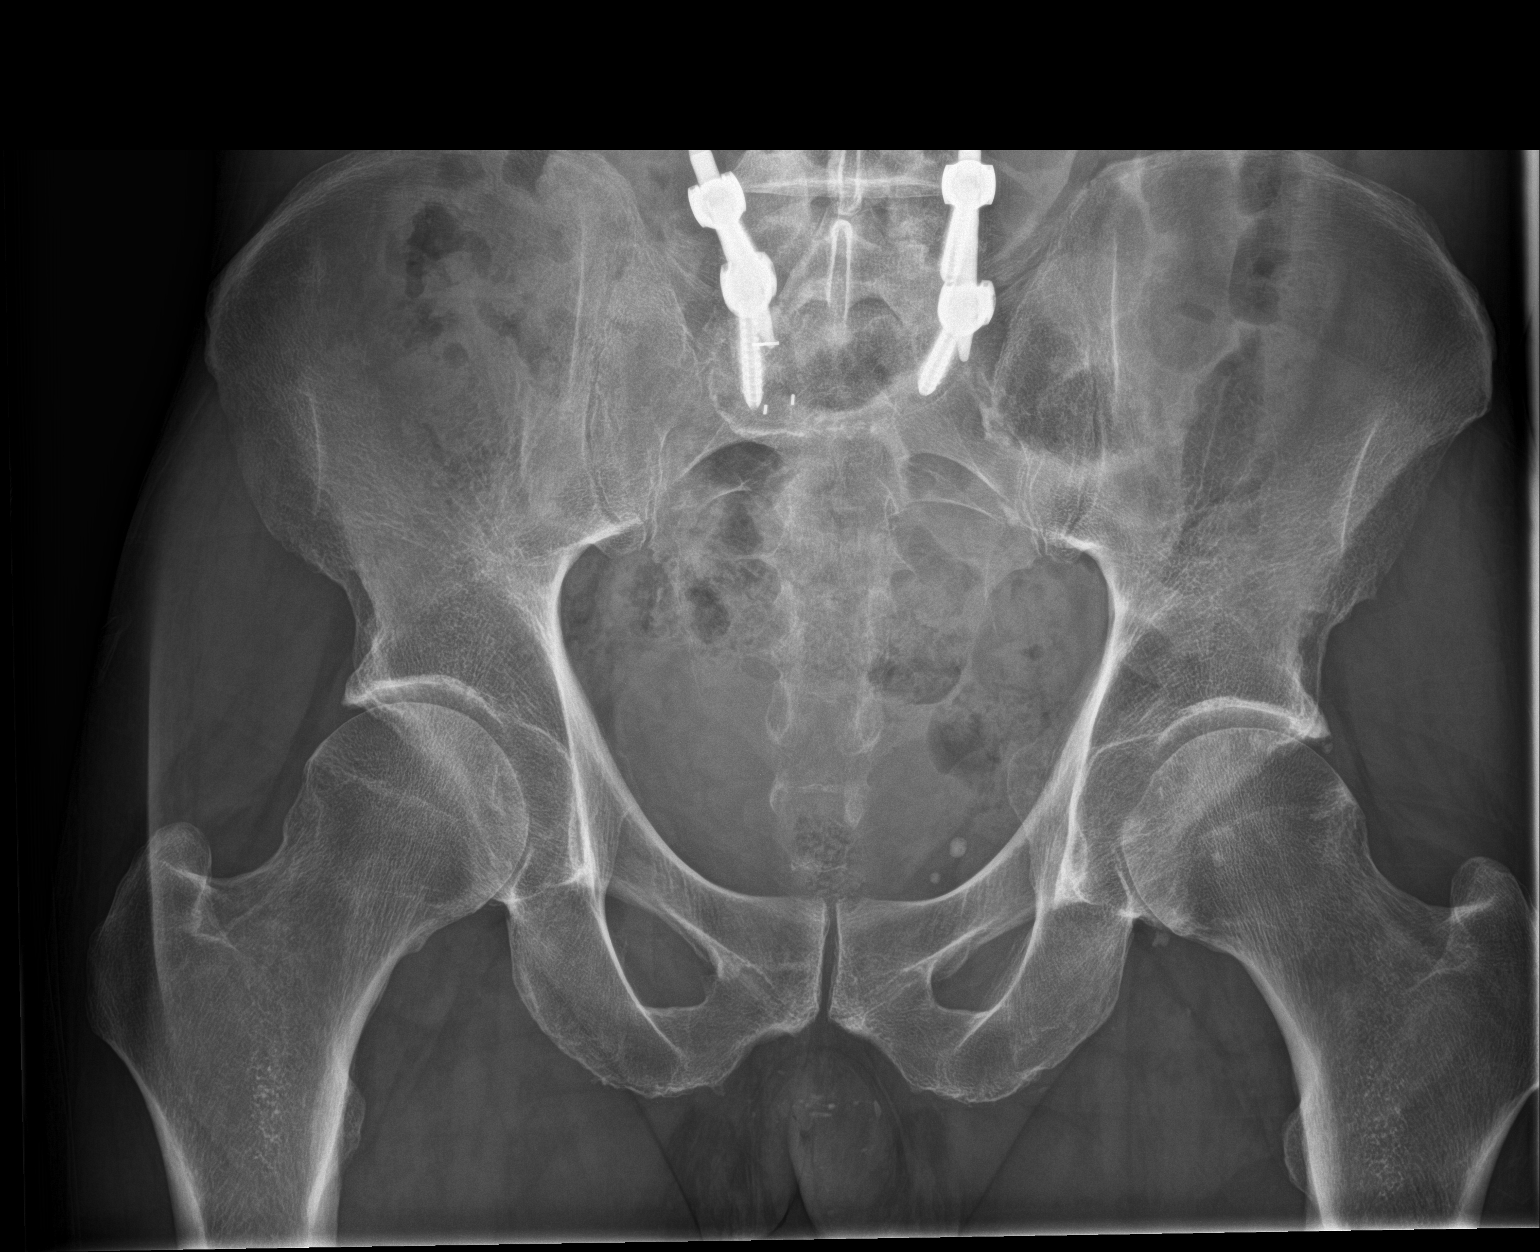

[hip ap]
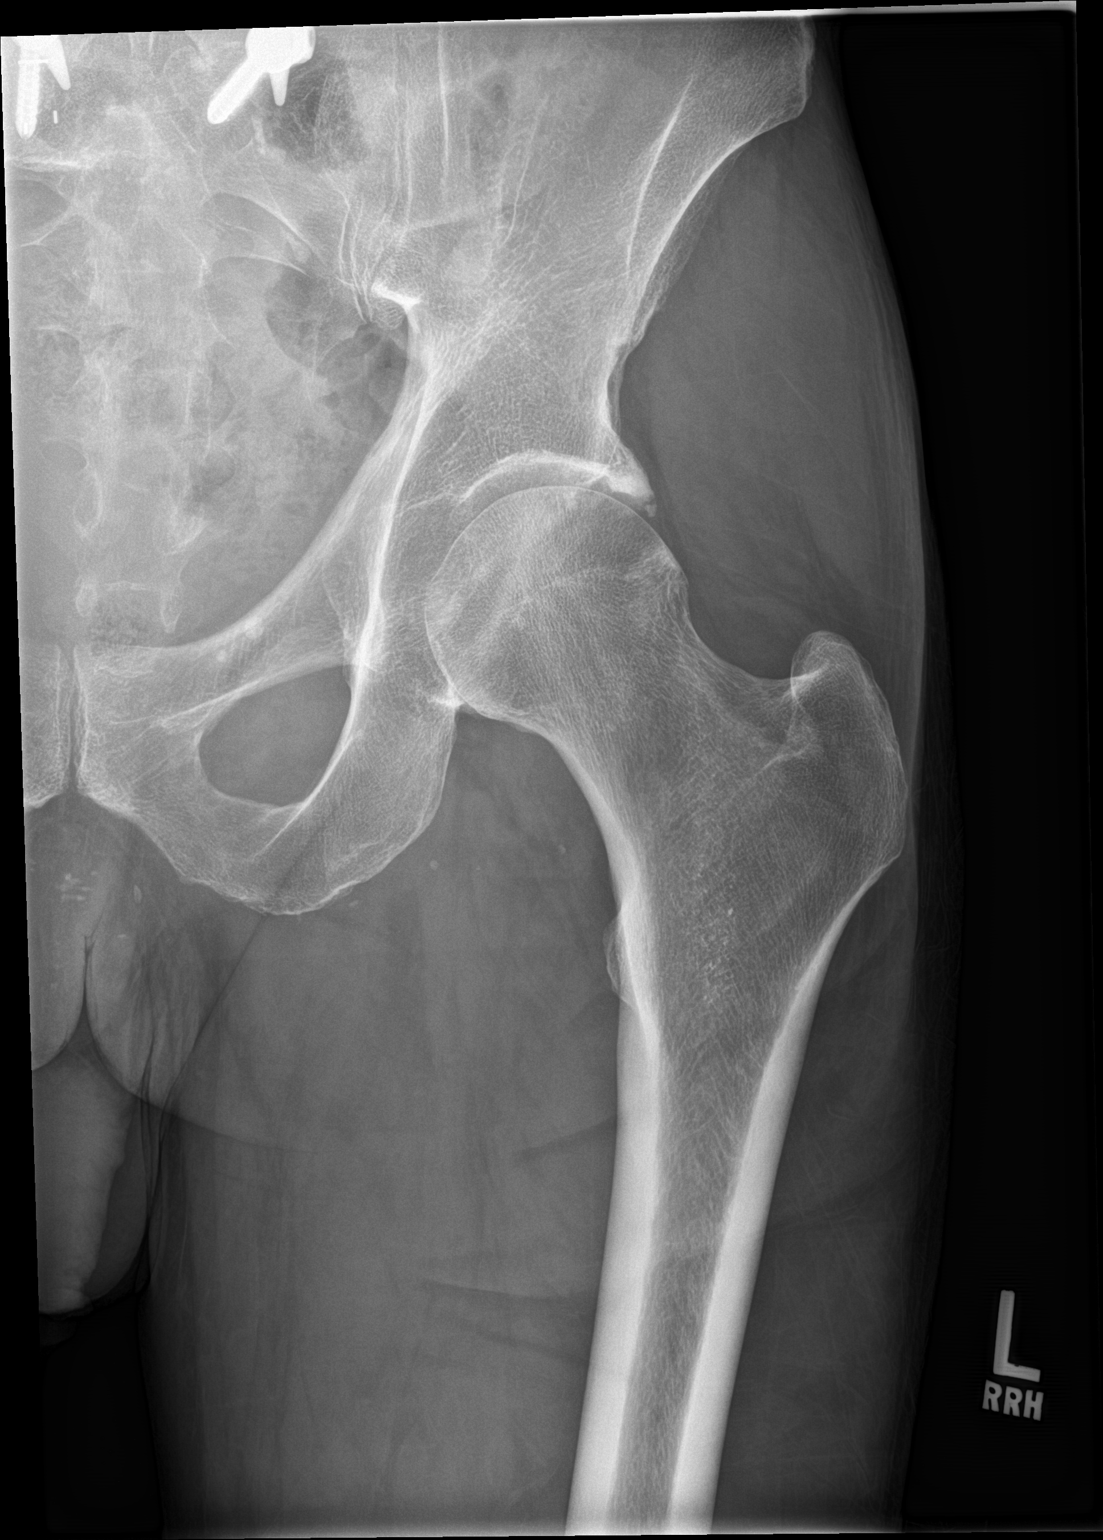

[hip lat]
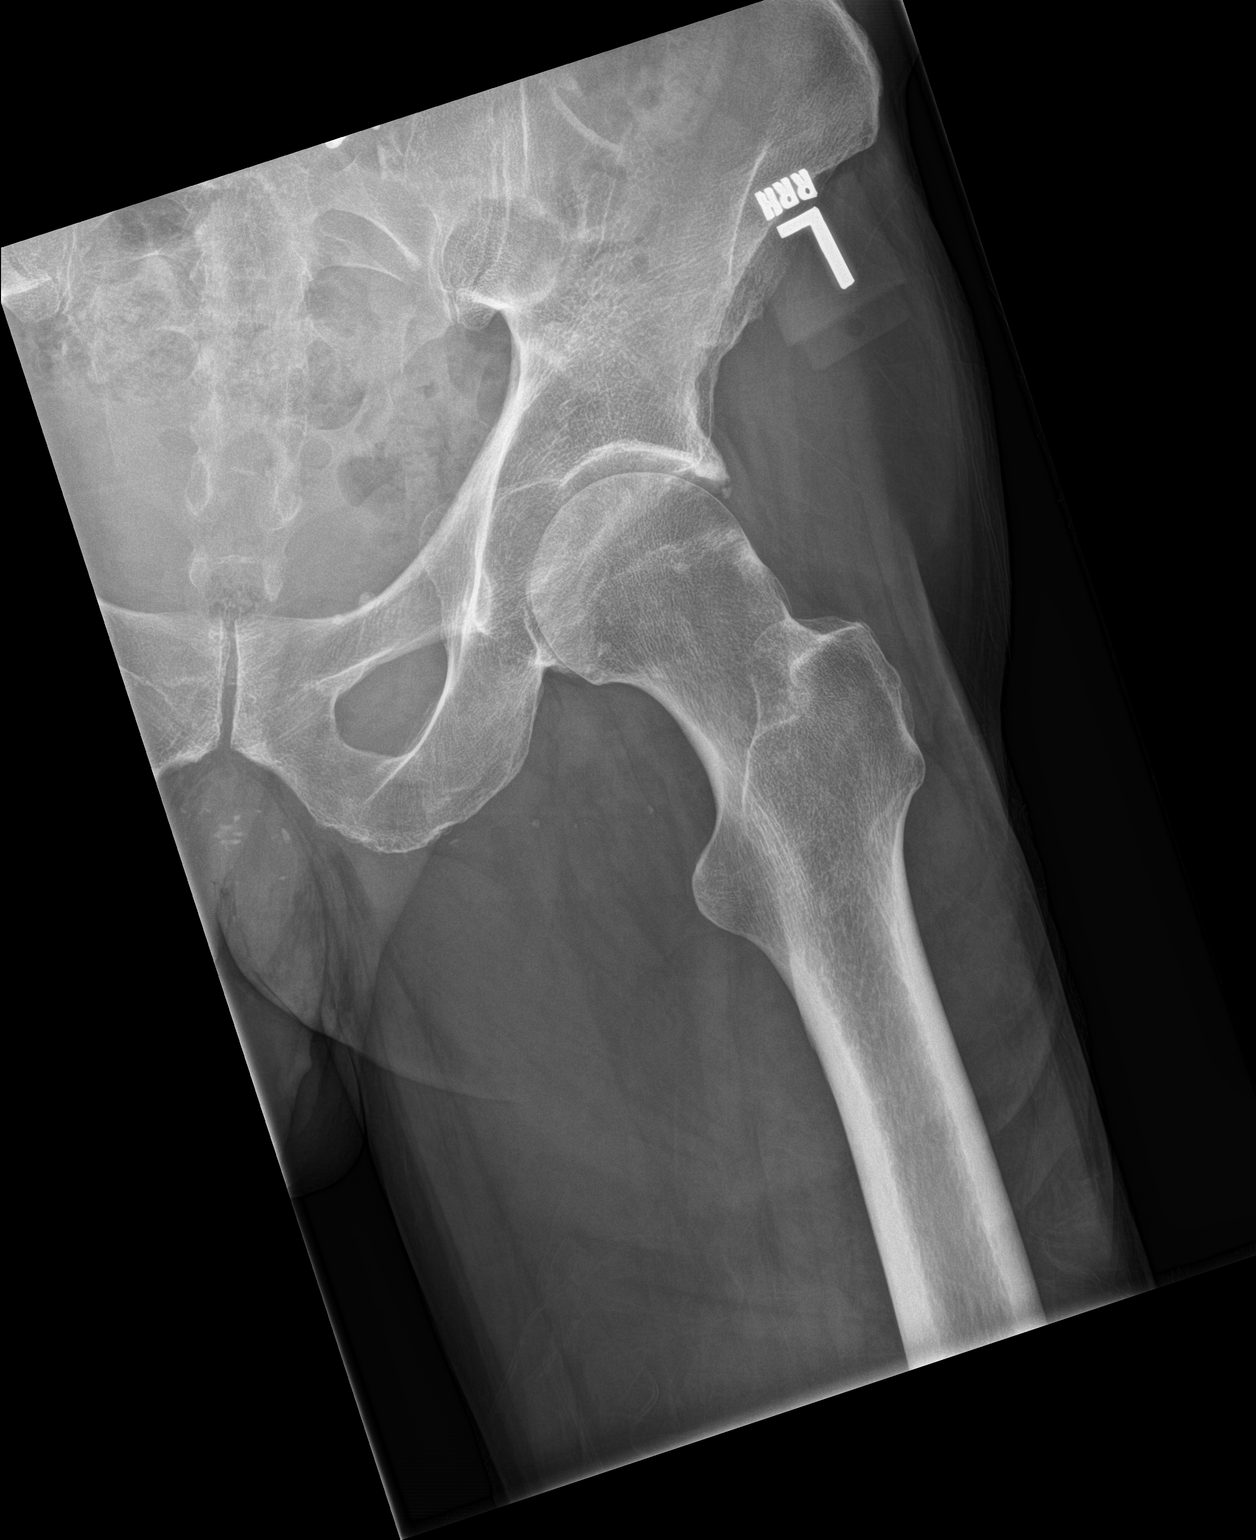

[3 of 3 positions shown; findings below may reference images not displayed]

FINDINGS: Degenerative changes left hip. Loose bodies may be present about the
left hip joint. Tiny bony density noted adjacent to the acetabulum.
This is most likely a tiny osteophyte fragment. Left femoral head
and neck are intact. Plate and screw fixation of the lumbosacral
spine noted. Hardware appears to be intact. Pelvic calcifications
consistent with phleboliths.
IMPRESSION: Left hip DJD. Tiny bony density noted adjacent to the lateral aspect
of the acetabulum, most likely tiny osteophyte fragment. Left
femoral head and neck are intact.

## 2019-02-15 IMAGING — MR MR HIP*L* W/O CM
5 series · 37 of 40 positions shown · non-contrast
Comparison: None.

CLINICAL DATA: Left hip pain for 4 months, no known injury

EXAM:
MR OF THE LEFT HIP WITHOUT CONTRAST
TECHNIQUE: Multiplanar, multisequence MR imaging was performed. No intravenous
contrast was administered.

[Series 2: t1_ir_cor · coronal · 4.5mm · 1.25mm/px · 9 of 28 slices shown]
[im 1/28]
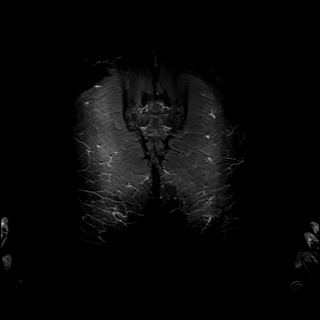
[im 4/28]
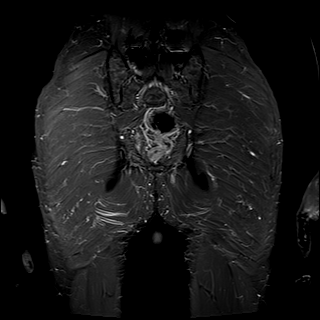
[im 7/28]
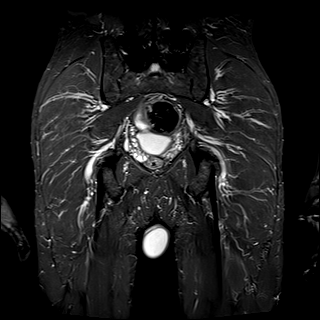
[im 11/28]
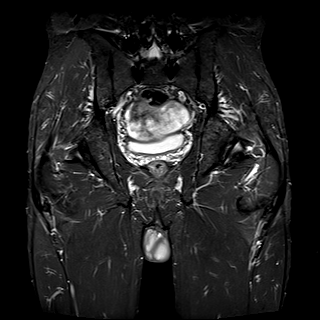
[im 14/28]
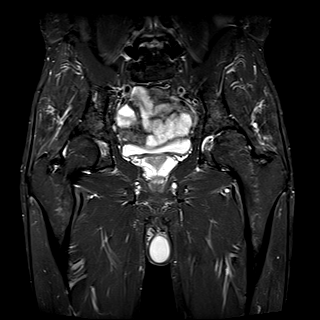
[im 17/28]
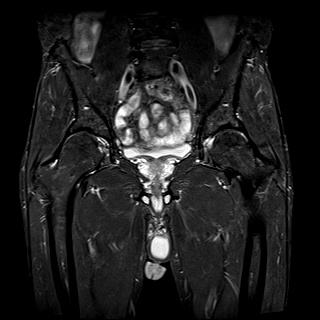
[im 21/28]
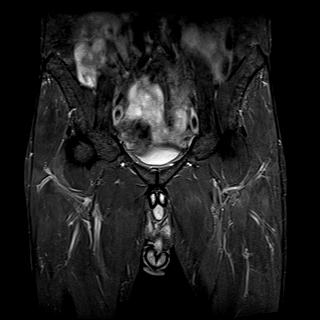
[im 24/28]
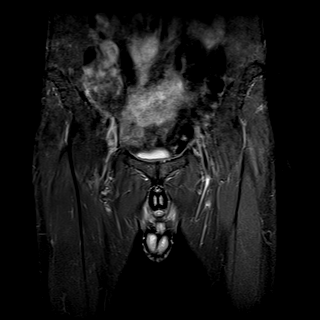
[im 28/28]
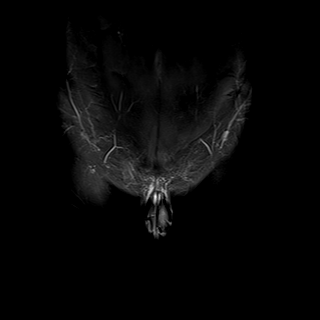

[Series 3: t1_tse_cor · coronal · 4.0mm · 0.91mm/px · 8 of 28 slices shown]
[im 1/28]
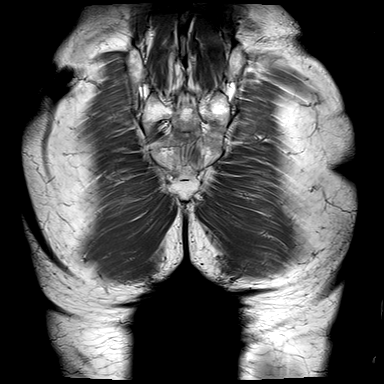
[im 4/28]
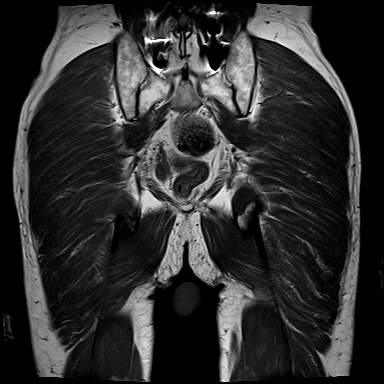
[im 8/28]
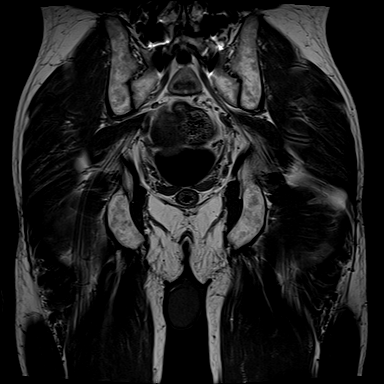
[im 12/28]
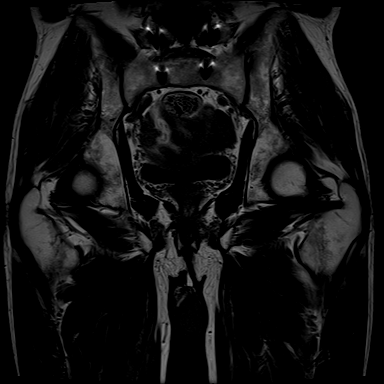
[im 16/28]
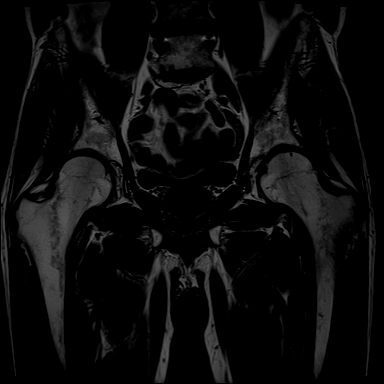
[im 20/28]
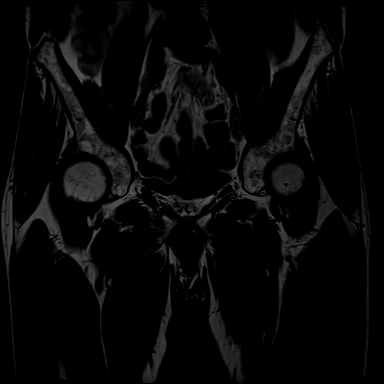
[im 24/28]
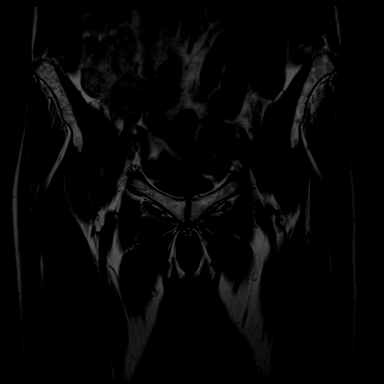
[im 28/28]
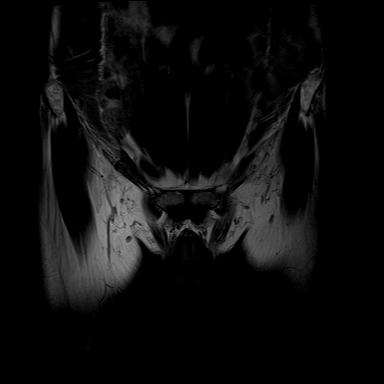

[Series 4: t2fs_tse_tra · axial · 4.0mm · 1.16mm/px · z∈[-125,+5]mm · 8 of 28 slices shown]
[im 1/28]
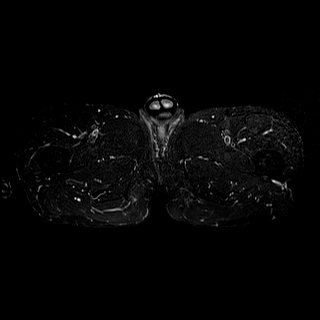
[im 4/28]
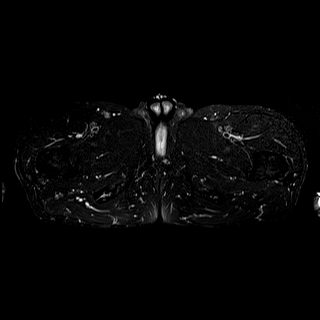
[im 8/28]
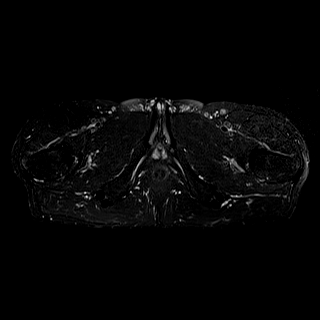
[im 12/28]
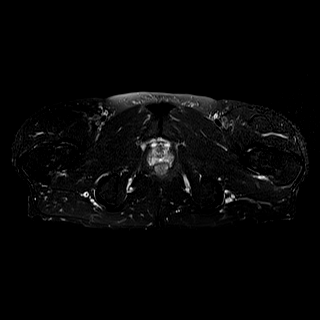
[im 16/28]
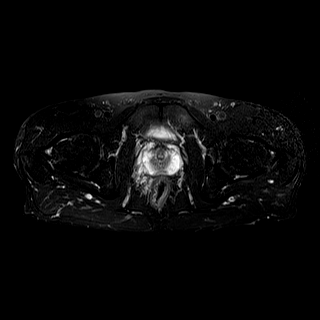
[im 20/28]
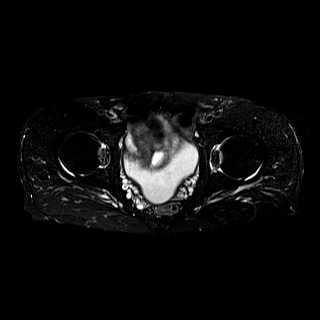
[im 24/28]
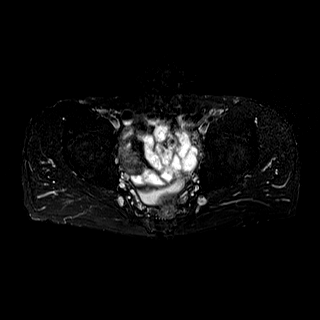
[im 28/28]
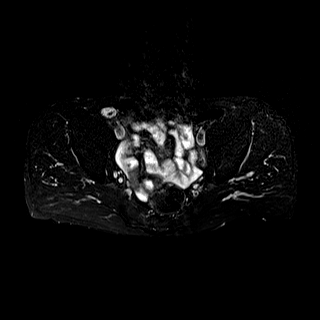

[Series 5: t1_tse_tra · axial · 4.0mm · 1.16mm/px · z∈[-125,+5]mm · 8 of 28 slices shown]
[im 1/28]
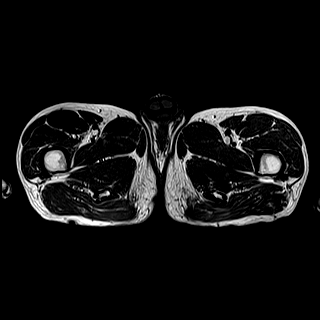
[im 4/28]
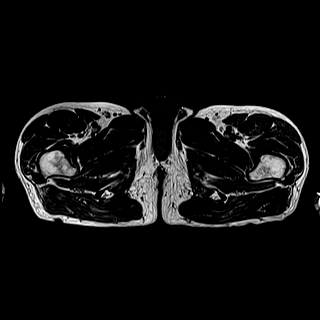
[im 8/28]
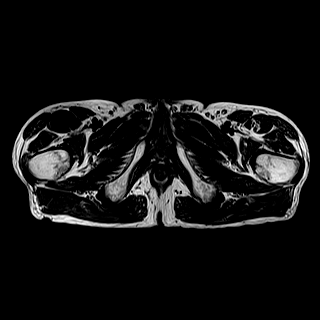
[im 12/28]
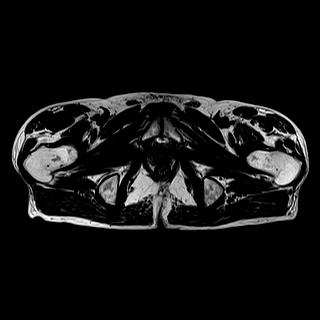
[im 16/28]
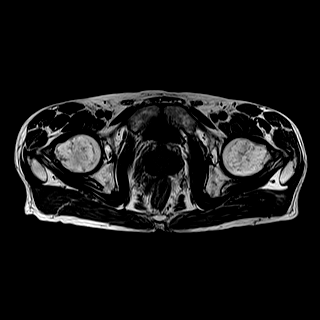
[im 20/28]
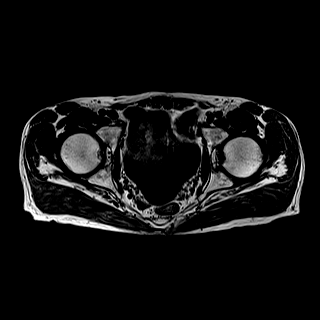
[im 24/28]
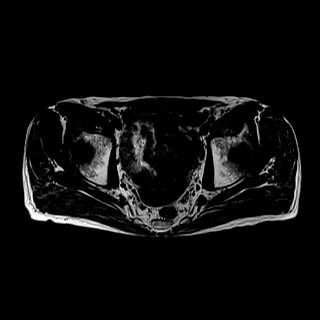
[im 28/28]
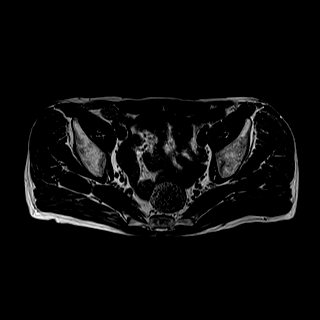

[Series 6: pd_tse_fs_sag left · sagittal · 4.0mm · 0.27mm/px · 4 of 24 slices shown]
[im 1/24]
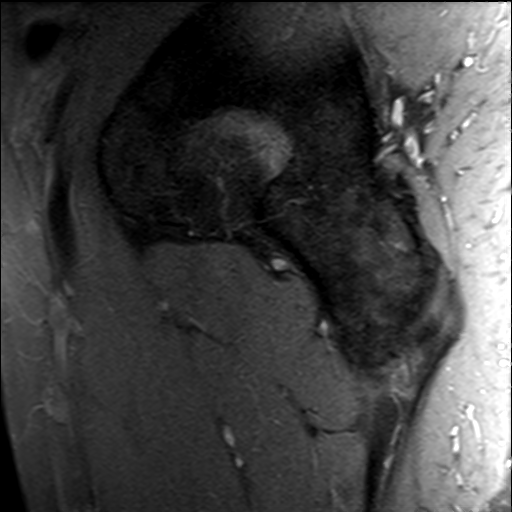
[im 4/24]
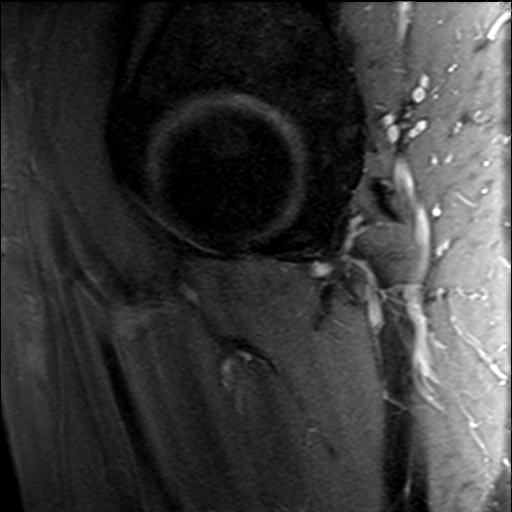
[im 8/24]
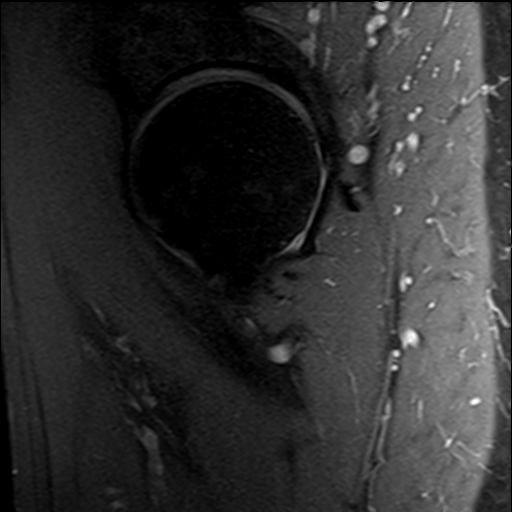
[im 12/24]
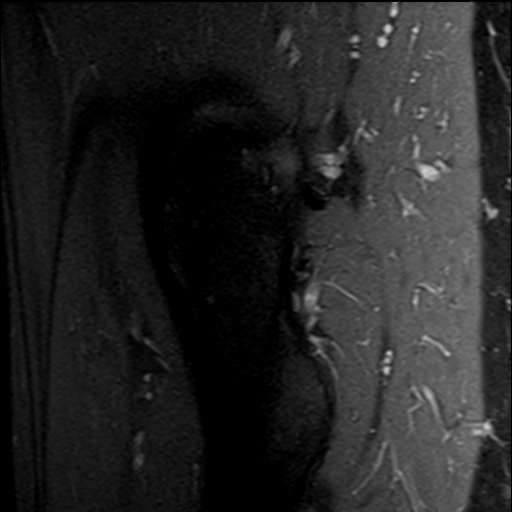

[37 of 40 positions shown; findings below may reference images not displayed]

FINDINGS: Bones: No hip fracture, dislocation or avascular necrosis. No
periosteal reaction or bone destruction. No aggressive osseous
lesion.

Normal sacrum and sacroiliac joints. No SI joint widening or erosive
changes.

Posterior lumbar fusion at L5-S1.

Articular cartilage and labrum

Articular cartilage: Mild partial-thickness cartilage loss of
bilateral hips.

Labrum: Grossly intact, but evaluation is limited by lack of
intraarticular fluid.

Joint or bursal effusion

Joint effusion:  No hip joint effusion.  No SI joint effusion.

Bursae:  No bursa formation.

Muscles and tendons

Flexors: Normal.

Extensors: Normal.

Abductors: Normal.

Adductors: Normal.

Gluteals: Normal.

Hamstrings: Normal.

Other findings

Miscellaneous: No pelvic free fluid. No fluid collection or
hematoma. No inguinal lymphadenopathy. No inguinal hernia.
IMPRESSION: 1. No hip fracture, dislocation or avascular necrosis.
2. Mild partial-thickness cartilage loss of bilateral hips.

## 2021-05-10 ENCOUNTER — Other Ambulatory Visit: Payer: Self-pay | Admitting: Nurse Practitioner

## 2021-05-10 ENCOUNTER — Other Ambulatory Visit (HOSPITAL_COMMUNITY): Payer: Self-pay | Admitting: Nurse Practitioner

## 2021-05-10 DIAGNOSIS — M47817 Spondylosis without myelopathy or radiculopathy, lumbosacral region: Secondary | ICD-10-CM

## 2021-05-27 ENCOUNTER — Ambulatory Visit (HOSPITAL_COMMUNITY)
Admission: RE | Admit: 2021-05-27 | Discharge: 2021-05-27 | Disposition: A | Discharge status: Home / Self Care | Payer: Medicare Other | Source: Ambulatory Visit | Attending: Nurse Practitioner | Admitting: Nurse Practitioner

## 2021-05-27 DIAGNOSIS — M47817 Spondylosis without myelopathy or radiculopathy, lumbosacral region: Secondary | ICD-10-CM | POA: Insufficient documentation

## 2021-09-21 ENCOUNTER — Other Ambulatory Visit: Payer: Self-pay | Admitting: Neurosurgery

## 2021-09-21 DIAGNOSIS — M5412 Radiculopathy, cervical region: Secondary | ICD-10-CM

## 2021-10-03 ENCOUNTER — Ambulatory Visit
Admission: RE | Admit: 2021-10-03 | Discharge: 2021-10-03 | Disposition: A | Discharge status: Home / Self Care | Payer: Medicare Other | Source: Ambulatory Visit | Attending: Neurosurgery | Admitting: Neurosurgery

## 2021-10-03 DIAGNOSIS — M5412 Radiculopathy, cervical region: Secondary | ICD-10-CM

## 2022-11-27 ENCOUNTER — Encounter: Payer: Self-pay | Admitting: Family Medicine

## 2022-12-19 ENCOUNTER — Other Ambulatory Visit (HOSPITAL_COMMUNITY): Payer: Self-pay | Admitting: Family Medicine

## 2022-12-19 DIAGNOSIS — R9389 Abnormal findings on diagnostic imaging of other specified body structures: Secondary | ICD-10-CM

## 2023-01-11 ENCOUNTER — Encounter (HOSPITAL_COMMUNITY): Payer: Self-pay

## 2023-01-11 ENCOUNTER — Ambulatory Visit (HOSPITAL_COMMUNITY): Payer: 59

## 2023-01-19 ENCOUNTER — Encounter (HOSPITAL_COMMUNITY)
Admission: RE | Admit: 2023-01-19 | Discharge: 2023-01-19 | Disposition: A | Discharge status: Home / Self Care | Payer: 59 | Source: Ambulatory Visit | Attending: Family Medicine | Admitting: Family Medicine

## 2023-01-19 DIAGNOSIS — R9389 Abnormal findings on diagnostic imaging of other specified body structures: Secondary | ICD-10-CM | POA: Insufficient documentation

## 2023-01-19 LAB — GLUCOSE, CAPILLARY
Glucose-Capillary: 104 mg/dL — ABNORMAL HIGH (ref 70–99)
Glucose-Capillary: 95 mg/dL (ref 70–99)

## 2023-01-19 MED ORDER — FLUDEOXYGLUCOSE F - 18 (FDG) INJECTION
7.0000 | Freq: Once | INTRAVENOUS | Status: AC | PRN
  Administered 2023-01-19: 6.8 via INTRAVENOUS

## 2023-05-08 ENCOUNTER — Other Ambulatory Visit: Payer: Self-pay | Admitting: Thoracic Surgery (Cardiothoracic Vascular Surgery)

## 2023-05-08 ENCOUNTER — Ambulatory Visit (HOSPITAL_COMMUNITY)
Admission: RE | Admit: 2023-05-08 | Discharge: 2023-05-08 | Disposition: A | Discharge status: Home / Self Care | Source: Ambulatory Visit | Attending: Cardiology | Admitting: Cardiology

## 2023-05-08 ENCOUNTER — Encounter: Payer: Self-pay | Admitting: Thoracic Surgery (Cardiothoracic Vascular Surgery)

## 2023-05-08 ENCOUNTER — Ambulatory Visit
Attending: Thoracic Surgery (Cardiothoracic Vascular Surgery) | Admitting: Thoracic Surgery (Cardiothoracic Vascular Surgery)

## 2023-05-08 VITALS — BP 95/65 | HR 81 | Resp 20 | Ht 70.0 in | Wt 119.3 lb

## 2023-05-08 DIAGNOSIS — R918 Other nonspecific abnormal finding of lung field: Secondary | ICD-10-CM | POA: Diagnosis not present

## 2023-05-08 NOTE — H&P (View-Only) (Signed)
 PCP is Barbaraann Bookbinder, Rosea Conch, NP Referring Provider is Cozetta Divers, MD  Chief Complaint  Patient presents with   Lung Mass    Surgical consult, PET Scan 01/19/23/ Chest CT 11/06/22/ PFT's 04/05/23    HPI: Miguel Gutierrez is sent for consultation regarding a left lower lobe lung nodule.  Miguel Gutierrez is a 63 year old man with a history of tobacco abuse, COPD, degenerative joint disease, chronic back pain, chronic narcotic dependence, coronary and aortic atherosclerosis, and a left lower lobe lung nodule.  He had a low-dose CT for lung cancer screening which found a left lower lobe lung nodule.  He then had a PET/CT in January 2025 which showed a 2 cm subsolid nodule in the left lower lobe that was hypermetabolic with an SUV of 5.  There was no mediastinal or hilar adenopathy.  He has 60-pack-year history of smoking (1.5 ppd x 40 years).  He has been trying to quit but has been unable to.  He currently is smoking about 0.5 ppd.    Had an episode of stabbing chest pain about a month ago which was short-lived.  Has shortness of breath both at rest and with exertion.  Also has wheezing.  Shortness of breath relieved using bronchodilators.  Physical activity is limited by back and leg pain more so than breathing.  Disabled due to his chronic pain.  Zubrod Score: At the time of surgery this patient's most appropriate activity status/level should be described as: []     0    Normal activity, no symptoms []     1    Restricted in physical strenuous activity but ambulatory, able to do out light work [x]     2    Ambulatory and capable of self care, unable to do work activities, up and about >50 % of waking hours                              []     3    Only limited self care, in bed greater than 50% of waking hours []     4    Completely disabled, no self care, confined to bed or chair []     5    Moribund  Past Medical History:  Diagnosis Date   Arthritis    Chronic back pain    Chronic neck pain      Past Surgical History:  Procedure Laterality Date   ANTERIOR CERVICAL DECOMP/DISCECTOMY FUSION N/A 02/05/2014   Procedure: ANTERIOR CERVICAL DECOMPRESSION/DISCECTOMY FUSION CERVICAL SIX-SEVEN;  Surgeon: Baruch Bosch, MD;  Location: [facility];  Service: Neurosurgery;  Laterality: N/A;   BACK SURGERY  3/11   CALDWELL LUC SINUSOTOMY N/A 07/03/2014   Procedure: CALDWELL LUC SINUSOTOMY;  Surgeon: Ascencion Lava, DDS;  Location: [facility];  Service: Oral Surgery;  Laterality: N/A;   NECK SURGERY  7/10    Family History  Problem Relation Age of Onset   Cancer Mother     Social History Social History   Tobacco Use   Smoking status: Every Day    Current packs/day: 1.50    Average packs/day: 1.5 packs/day for 40.0 years (60.0 ttl pk-yrs)    Types: Cigarettes   Smokeless tobacco: Never   Tobacco comments:    using electronic cigarettes currently  Vaping Use   Vaping status: Never Used  Substance Use Topics   Alcohol use: Yes    Comment: occ none in 3 months   Drug  use: Yes    Frequency: 14.0 times per week    Types: Marijuana    Comment: uses almost every day to help with pain control    Current Outpatient Medications  Medication Sig Dispense Refill   cholecalciferol (VITAMIN D) 1000 UNITS tablet Take 2,000 Units by mouth daily.     diazepam  (VALIUM ) 5 MG tablet Take 1-2 tablets (5-10 mg total) by mouth every 6 (six) hours as needed for muscle spasms. 60 tablet 0   loratadine -pseudoephedrine  (CLARITIN -D 24 HOUR) 10-240 MG per 24 hr tablet Take 1 tablet by mouth daily. 14 tablet 0   metroNIDAZOLE  (FLAGYL ) 500 MG tablet Take 1 tablet (500 mg total) by mouth 3 (three) times daily. 21 tablet 0   oxyCODONE -acetaminophen  (PERCOCET/ROXICET) 5-325 MG tablet Take 1 tablet by mouth every 4 (four) hours as needed. 18 tablet 0   oxymetazoline  (AFRIN NASAL SPRAY) 0.05 % nasal spray Place 1 spray into both nostrils 2 (two) times daily. 30 mL 0   No current facility-administered medications for  this visit.    Allergies  Allergen Reactions   Penicillins Anaphylaxis   Spinach Anaphylaxis   Sulfa Antibiotics Anaphylaxis   Chantix [Varenicline] Nausea Only    nightmares    Review of Systems  Constitutional:  Positive for appetite change. Negative for fatigue and unexpected weight change.  HENT:  Negative for trouble swallowing.   Respiratory:  Positive for cough, shortness of breath and wheezing.   Cardiovascular:  Negative for chest pain.  Genitourinary:  Negative for difficulty urinating and dysuria.  Musculoskeletal:  Positive for arthralgias, back pain and gait problem.  Neurological:  Negative for weakness.       Chronic pain  Hematological:  Does not bruise/bleed easily.  Psychiatric/Behavioral:  Positive for dysphoric mood.     BP 95/65 (BP Location: Right Arm, Patient Position: Sitting, Cuff Size: Normal)   Pulse 81   Resp 20   Ht 5\' 10"  (1.778 m)   Wt 119 lb 4.8 oz (54.1 kg)   SpO2 95% Comment: RA  BMI 17.12 kg/m  Physical Exam Vitals reviewed.  Constitutional:      Comments: Cachectic  HENT:     Head: Normocephalic and atraumatic.  Eyes:     General: No scleral icterus.    Extraocular Movements: Extraocular movements intact.  Cardiovascular:     Rate and Rhythm: Normal rate and regular rhythm.     Heart sounds: Normal heart sounds. No murmur heard. Pulmonary:     Effort: Pulmonary effort is normal. No respiratory distress.     Breath sounds: No wheezing.     Comments: Diminished breath sounds bilaterally Abdominal:     Palpations: Abdomen is soft.  Musculoskeletal:     Right lower leg: No edema.     Left lower leg: No edema.     Comments: Thenar wasting  Lymphadenopathy:     Cervical: No cervical adenopathy.  Skin:    General: Skin is warm and dry.  Neurological:     General: No focal deficit present.     Mental Status: He is alert and oriented to person, place, and time.     Cranial Nerves: No cranial nerve deficit.    Diagnostic  Tests: NUCLEAR MEDICINE PET SKULL BASE TO THIGH   TECHNIQUE: 6.9 mCi F-18 FDG was injected intravenously. Full-ring PET imaging was performed from the skull base to thigh after the radiotracer. CT data was obtained and used for attenuation correction and anatomic localization.   Fasting blood glucose:  95 mg/dl   COMPARISON:  None available   FINDINGS: Mediastinal blood pool activity: SUV max 1.54   Liver activity: SUV max NA   NECK: No hypermetabolic lymph nodes in the neck.   Incidental CT findings: None.   CHEST: Left lower lobe complex sub solid 2 cm lesion demonstrates hypermetabolism with SUV max of 5.0. Findings consistent with a primary lung neoplasm. No enlarged or hypermetabolic mediastinal or hilar lymph nodes. No hypermetabolic chest wall lesions, supraclavicular or axillary adenopathy.   No other pulmonary lesions or pulmonary nodules are identified on the CT scan.   Incidental CT findings: Advanced emphysematous changes and areas of pulmonary scarring. The central tracheobronchial tree is unremarkable. Age advanced aortic and three-vessel coronary artery calcifications.   ABDOMEN/PELVIS: No abnormal hypermetabolic activity within the liver, pancreas, adrenal glands, or spleen. No hypermetabolic lymph nodes in the abdomen or pelvis.   Incidental CT findings: Simple left renal cysts not requiring any further imaging evaluation or follow-up. Age advanced atherosclerotic calcifications involving the aorta and iliac arteries but no aneurysm. Mild prostate gland enlargement.   SKELETON: No focal hypermetabolic activity to suggest skeletal metastasis.   Incidental CT findings: None.   IMPRESSION: 1. Hypermetabolic complex sub solid 2 cm lesion in the left lower lobe consistent with a primary lung neoplasm. 2. No findings for metastatic disease involving the neck, chest, abdomen/pelvis or bony structures. 3. Age advanced aortic and three-vessel coronary  artery calcifications. 4. Advanced emphysematous changes. 5. Prostate gland enlargement.   Aortic Atherosclerosis (ICD10-I70.0) and Emphysema (ICD10-J43.9).     Electronically Signed   By: Marrian Siva M.D.   On: 01/27/2023 16:25 I personally reviewed the PET/CT images.  There is a spiculated mixed subsolid and solid nodule in the left lower lobe measuring approximately 2 cm.  Hypermetabolic with an SUV of 5.0.  No mediastinal or hilar adenopathy.  He does have coronary and aortic atherosclerosis.  Pulmonary function testing [facility] 04/05/2023 FVC 3.84 (96%) FEV1 1.50 (47%) FEV1 2.00 (62%) postbronchodilator TLC 7.88 (128%) RV 4.05 (180%) DLCO 13.5 (77%)  Impression: Miguel Gutierrez is a 63 year old man with a history of tobacco abuse, COPD, degenerative joint disease, chronic back pain, chronic narcotic dependence, coronary and aortic atherosclerosis, and a left lower lobe lung nodule.  Left lower lobe lung nodule-I reviewed the PET/CT images with Miguel Gutierrez and his friend who accompanied him.  We discussed the differential diagnosis which includes primary bronchogenic carcinoma as well as infectious and inflammatory nodules.  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  Clinical stage is 1A (T1, N0).  We discussed potential treatment options including surgical resection and stereotactic radiation.  He said some family members who had bad outcomes with radiation.  I explained to him that this would likely be very different and if he is interested in pursuing that approach, I would be happy to arrange for consultation.  We discussed the advantages and disadvantages of each approach.  Primary advantage for surgery is the better chance for a cure.  However comes with the cost of pain and digital surgical complications.  Radiation has less upfront risk with a higher risk of recurrence.  He strongly favor surgery and does not want to meet with  radiation oncology.  He has adequate pulmonary function to tolerate a lobectomy.  However, I did discuss with him that he would notice a significant decrease in his respiratory capacity after surgery.  I described the postoperative procedure to Miguel Gutierrez  and his friend.  I informed him of the general nature of the procedure including the need for general anesthesia, the incisions to be used, the use of the surgical robot, use of a drainage tube postoperatively, the expected hospital stay, and the overall recovery.  I informed him of the indications, risks, benefits, and alternatives.  He understands the risks include, but not limited to death, MI, DVT, PE, bleeding, possible need for transfusion, infection, prolonged air leak, cardiac arrhythmias, as well as possibility of other unstable complications.  He has chronic pain despite benzodiazepine and narcotic dependent.  He is likely to have significant pain issues with surgery.  I informed him of this.  I informed her intention to treat that as well as possible but that we cannot possibly guarantee he will have significant pain issues.  He wishes to proceed with surgery.  It has been over 3 months since his most recent scan so we need to repeat a CT to make sure that has not been any significant changes before we proceed.  Plan: CT chest to follow-up lung nodule Robotic assisted left lower lobectomy.  Will schedule once we know when his CT scan will be.  Zelphia Higashi, MD [facility] [phone]

## 2023-05-08 NOTE — Progress Notes (Signed)
 PCP is Barbaraann Bookbinder, Rosea Conch, NP Referring Provider is Cozetta Divers, MD  Chief Complaint  Patient presents with   Lung Mass    Surgical consult, PET Scan 01/19/23/ Chest CT 11/06/22/ PFT's 04/05/23    HPI: Mr. Shankel is sent for consultation regarding a left lower lobe lung nodule.  Valley Meneley is a 63 year old man with a history of tobacco abuse, COPD, degenerative joint disease, chronic back pain, chronic narcotic dependence, coronary and aortic atherosclerosis, and a left lower lobe lung nodule.  He had a low-dose CT for lung cancer screening which found a left lower lobe lung nodule.  He then had a PET/CT in January 2025 which showed a 2 cm subsolid nodule in the left lower lobe that was hypermetabolic with an SUV of 5.  There was no mediastinal or hilar adenopathy.  He has 60-pack-year history of smoking (1.5 ppd x 40 years).  He has been trying to quit but has been unable to.  He currently is smoking about 0.5 ppd.    Had an episode of stabbing chest pain about a month ago which was short-lived.  Has shortness of breath both at rest and with exertion.  Also has wheezing.  Shortness of breath relieved using bronchodilators.  Physical activity is limited by back and leg pain more so than breathing.  Disabled due to his chronic pain.  Zubrod Score: At the time of surgery this patient's most appropriate activity status/level should be described as: []     0    Normal activity, no symptoms []     1    Restricted in physical strenuous activity but ambulatory, able to do out light work [x]     2    Ambulatory and capable of self care, unable to do work activities, up and about >50 % of waking hours                              []     3    Only limited self care, in bed greater than 50% of waking hours []     4    Completely disabled, no self care, confined to bed or chair []     5    Moribund  Past Medical History:  Diagnosis Date   Arthritis    Chronic back pain    Chronic neck pain      Past Surgical History:  Procedure Laterality Date   ANTERIOR CERVICAL DECOMP/DISCECTOMY FUSION N/A 02/05/2014   Procedure: ANTERIOR CERVICAL DECOMPRESSION/DISCECTOMY FUSION CERVICAL SIX-SEVEN;  Surgeon: Baruch Bosch, MD;  Location: [facility];  Service: Neurosurgery;  Laterality: N/A;   BACK SURGERY  3/11   CALDWELL LUC SINUSOTOMY N/A 07/03/2014   Procedure: CALDWELL LUC SINUSOTOMY;  Surgeon: Ascencion Lava, DDS;  Location: [facility];  Service: Oral Surgery;  Laterality: N/A;   NECK SURGERY  7/10    Family History  Problem Relation Age of Onset   Cancer Mother     Social History Social History   Tobacco Use   Smoking status: Every Day    Current packs/day: 1.50    Average packs/day: 1.5 packs/day for 40.0 years (60.0 ttl pk-yrs)    Types: Cigarettes   Smokeless tobacco: Never   Tobacco comments:    using electronic cigarettes currently  Vaping Use   Vaping status: Never Used  Substance Use Topics   Alcohol use: Yes    Comment: occ none in 3 months   Drug  use: Yes    Frequency: 14.0 times per week    Types: Marijuana    Comment: uses almost every day to help with pain control    Current Outpatient Medications  Medication Sig Dispense Refill   cholecalciferol (VITAMIN D) 1000 UNITS tablet Take 2,000 Units by mouth daily.     diazepam  (VALIUM ) 5 MG tablet Take 1-2 tablets (5-10 mg total) by mouth every 6 (six) hours as needed for muscle spasms. 60 tablet 0   loratadine -pseudoephedrine  (CLARITIN -D 24 HOUR) 10-240 MG per 24 hr tablet Take 1 tablet by mouth daily. 14 tablet 0   metroNIDAZOLE  (FLAGYL ) 500 MG tablet Take 1 tablet (500 mg total) by mouth 3 (three) times daily. 21 tablet 0   oxyCODONE -acetaminophen  (PERCOCET/ROXICET) 5-325 MG tablet Take 1 tablet by mouth every 4 (four) hours as needed. 18 tablet 0   oxymetazoline  (AFRIN NASAL SPRAY) 0.05 % nasal spray Place 1 spray into both nostrils 2 (two) times daily. 30 mL 0   No current facility-administered medications for  this visit.    Allergies  Allergen Reactions   Penicillins Anaphylaxis   Spinach Anaphylaxis   Sulfa Antibiotics Anaphylaxis   Chantix [Varenicline] Nausea Only    nightmares    Review of Systems  Constitutional:  Positive for appetite change. Negative for fatigue and unexpected weight change.  HENT:  Negative for trouble swallowing.   Respiratory:  Positive for cough, shortness of breath and wheezing.   Cardiovascular:  Negative for chest pain.  Genitourinary:  Negative for difficulty urinating and dysuria.  Musculoskeletal:  Positive for arthralgias, back pain and gait problem.  Neurological:  Negative for weakness.       Chronic pain  Hematological:  Does not bruise/bleed easily.  Psychiatric/Behavioral:  Positive for dysphoric mood.     BP 95/65 (BP Location: Right Arm, Patient Position: Sitting, Cuff Size: Normal)   Pulse 81   Resp 20   Ht 5\' 10"  (1.778 m)   Wt 119 lb 4.8 oz (54.1 kg)   SpO2 95% Comment: RA  BMI 17.12 kg/m  Physical Exam Vitals reviewed.  Constitutional:      Comments: Cachectic  HENT:     Head: Normocephalic and atraumatic.  Eyes:     General: No scleral icterus.    Extraocular Movements: Extraocular movements intact.  Cardiovascular:     Rate and Rhythm: Normal rate and regular rhythm.     Heart sounds: Normal heart sounds. No murmur heard. Pulmonary:     Effort: Pulmonary effort is normal. No respiratory distress.     Breath sounds: No wheezing.     Comments: Diminished breath sounds bilaterally Abdominal:     Palpations: Abdomen is soft.  Musculoskeletal:     Right lower leg: No edema.     Left lower leg: No edema.     Comments: Thenar wasting  Lymphadenopathy:     Cervical: No cervical adenopathy.  Skin:    General: Skin is warm and dry.  Neurological:     General: No focal deficit present.     Mental Status: He is alert and oriented to person, place, and time.     Cranial Nerves: No cranial nerve deficit.    Diagnostic  Tests: NUCLEAR MEDICINE PET SKULL BASE TO THIGH   TECHNIQUE: 6.9 mCi F-18 FDG was injected intravenously. Full-ring PET imaging was performed from the skull base to thigh after the radiotracer. CT data was obtained and used for attenuation correction and anatomic localization.   Fasting blood glucose:  95 mg/dl   COMPARISON:  None available   FINDINGS: Mediastinal blood pool activity: SUV max 1.54   Liver activity: SUV max NA   NECK: No hypermetabolic lymph nodes in the neck.   Incidental CT findings: None.   CHEST: Left lower lobe complex sub solid 2 cm lesion demonstrates hypermetabolism with SUV max of 5.0. Findings consistent with a primary lung neoplasm. No enlarged or hypermetabolic mediastinal or hilar lymph nodes. No hypermetabolic chest wall lesions, supraclavicular or axillary adenopathy.   No other pulmonary lesions or pulmonary nodules are identified on the CT scan.   Incidental CT findings: Advanced emphysematous changes and areas of pulmonary scarring. The central tracheobronchial tree is unremarkable. Age advanced aortic and three-vessel coronary artery calcifications.   ABDOMEN/PELVIS: No abnormal hypermetabolic activity within the liver, pancreas, adrenal glands, or spleen. No hypermetabolic lymph nodes in the abdomen or pelvis.   Incidental CT findings: Simple left renal cysts not requiring any further imaging evaluation or follow-up. Age advanced atherosclerotic calcifications involving the aorta and iliac arteries but no aneurysm. Mild prostate gland enlargement.   SKELETON: No focal hypermetabolic activity to suggest skeletal metastasis.   Incidental CT findings: None.   IMPRESSION: 1. Hypermetabolic complex sub solid 2 cm lesion in the left lower lobe consistent with a primary lung neoplasm. 2. No findings for metastatic disease involving the neck, chest, abdomen/pelvis or bony structures. 3. Age advanced aortic and three-vessel coronary  artery calcifications. 4. Advanced emphysematous changes. 5. Prostate gland enlargement.   Aortic Atherosclerosis (ICD10-I70.0) and Emphysema (ICD10-J43.9).     Electronically Signed   By: Marrian Siva M.D.   On: 01/27/2023 16:25 I personally reviewed the PET/CT images.  There is a spiculated mixed subsolid and solid nodule in the left lower lobe measuring approximately 2 cm.  Hypermetabolic with an SUV of 5.0.  No mediastinal or hilar adenopathy.  He does have coronary and aortic atherosclerosis.  Pulmonary function testing [facility] 04/05/2023 FVC 3.84 (96%) FEV1 1.50 (47%) FEV1 2.00 (62%) postbronchodilator TLC 7.88 (128%) RV 4.05 (180%) DLCO 13.5 (77%)  Impression: Harwell Torian is a 63 year old man with a history of tobacco abuse, COPD, degenerative joint disease, chronic back pain, chronic narcotic dependence, coronary and aortic atherosclerosis, and a left lower lobe lung nodule.  Left lower lobe lung nodule-I reviewed the PET/CT images with Mr. Abdala and his friend who accompanied him.  We discussed the differential diagnosis which includes primary bronchogenic carcinoma as well as infectious and inflammatory nodules.  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  Clinical stage is 1A (T1, N0).  We discussed potential treatment options including surgical resection and stereotactic radiation.  He said some family members who had bad outcomes with radiation.  I explained to him that this would likely be very different and if he is interested in pursuing that approach, I would be happy to arrange for consultation.  We discussed the advantages and disadvantages of each approach.  Primary advantage for surgery is the better chance for a cure.  However comes with the cost of pain and digital surgical complications.  Radiation has less upfront risk with a higher risk of recurrence.  He strongly favor surgery and does not want to meet with  radiation oncology.  He has adequate pulmonary function to tolerate a lobectomy.  However, I did discuss with him that he would notice a significant decrease in his respiratory capacity after surgery.  I described the postoperative procedure to Mr. Klemens  and his friend.  I informed him of the general nature of the procedure including the need for general anesthesia, the incisions to be used, the use of the surgical robot, use of a drainage tube postoperatively, the expected hospital stay, and the overall recovery.  I informed him of the indications, risks, benefits, and alternatives.  He understands the risks include, but not limited to death, MI, DVT, PE, bleeding, possible need for transfusion, infection, prolonged air leak, cardiac arrhythmias, as well as possibility of other unstable complications.  He has chronic pain despite benzodiazepine and narcotic dependent.  He is likely to have significant pain issues with surgery.  I informed him of this.  I informed her intention to treat that as well as possible but that we cannot possibly guarantee he will have significant pain issues.  He wishes to proceed with surgery.  It has been over 3 months since his most recent scan so we need to repeat a CT to make sure that has not been any significant changes before we proceed.  Plan: CT chest to follow-up lung nodule Robotic assisted left lower lobectomy.  Will schedule once we know when his CT scan will be.  Zelphia Higashi, MD [facility] [phone]

## 2023-05-09 ENCOUNTER — Encounter: Payer: Self-pay | Admitting: *Deleted

## 2023-05-11 ENCOUNTER — Encounter: Payer: Self-pay | Admitting: *Deleted

## 2023-05-11 ENCOUNTER — Other Ambulatory Visit: Payer: Self-pay | Admitting: *Deleted

## 2023-05-11 DIAGNOSIS — R911 Solitary pulmonary nodule: Secondary | ICD-10-CM

## 2023-06-04 NOTE — Pre-Procedure Instructions (Signed)
 Surgical Instructions   Your procedure is scheduled on Thursday, June 5th. Report to [facility] Main Entrance "A" at 10:15 A.M., then check in with the Admitting office. Any questions or running late day of surgery: call [phone]  Questions prior to your surgery date: call [phone], Monday-Friday, 8am-4pm. If you experience any cold or flu symptoms such as cough, fever, chills, shortness of breath, etc. between now and your scheduled surgery, please notify us  at the above number.     Remember:  Do not eat or drink after midnight the night before your surgery    Take these medicines the morning of surgery with A SIP OF WATER  baclofen (LIORESAL)    May take these medicines IF NEEDED: albuterol (VENTOLIN HFA)- bring inhaler with you on day of surgery diazepam  (VALIUM )  oxyCODONE -acetaminophen  (PERCOCET)    One week prior to surgery, STOP taking any Aspirin (unless otherwise instructed by your surgeon) Aleve , Naproxen , Ibuprofen , Motrin , Advil , Goody's, BC's, all herbal medications, fish oil, and non-prescription vitamins.                     Do NOT Smoke (Tobacco/Vaping) for 24 hours prior to your procedure.  If you use a CPAP at night, you may bring your mask/headgear for your overnight stay.   You will be asked to remove any contacts, glasses, piercing's, hearing aid's, dentures/partials prior to surgery. Please bring cases for these items if needed.    Patients discharged the day of surgery will not be allowed to drive home, and someone needs to stay with them for 24 hours.  SURGICAL WAITING ROOM VISITATION Patients may have no more than 2 support people in the waiting area - these visitors may rotate.   Pre-op nurse will coordinate an appropriate time for 1 ADULT support person, who may not rotate, to accompany patient in pre-op.  Children under the age of 37 must have an adult with them who is not the patient and must remain in the main waiting area with an  adult.  If the patient needs to stay at the hospital during part of their recovery, the visitor guidelines for inpatient rooms apply.  Please refer to the [facility] website for the visitor guidelines for any additional information.   If you received a COVID test during your pre-op visit  it is requested that you wear a mask when out in public, stay away from anyone that may not be feeling well and notify your surgeon if you develop symptoms. If you have been in contact with anyone that has tested positive in the last 10 days please notify you surgeon.      Pre-operative CHG Bathing Instructions   You can play a key role in reducing the risk of infection after surgery. Your skin needs to be as free of germs as possible. You can reduce the number of germs on your skin by washing with CHG (chlorhexidine  gluconate) soap before surgery. CHG is an antiseptic soap that kills germs and continues to kill germs even after washing.   DO NOT use if you have an allergy to chlorhexidine /CHG or antibacterial soaps. If your skin becomes reddened or irritated, stop using the CHG and notify one of our RNs at [phone].              TAKE A SHOWER THE NIGHT BEFORE SURGERY AND THE DAY OF SURGERY    Please keep in mind the following:  DO NOT shave, including legs and underarms, 48  hours prior to surgery.   You may shave your face before/day of surgery.  Place clean sheets on your bed the night before surgery Use a clean washcloth (not used since being washed) for each shower. DO NOT sleep with pet's night before surgery.  CHG Shower Instructions:  Wash your face and private area with normal soap. If you choose to wash your hair, wash first with your normal shampoo.  After you use shampoo/soap, rinse your hair and body thoroughly to remove shampoo/soap residue.  Turn the water OFF and apply half the bottle of CHG soap to a CLEAN washcloth.  Apply CHG soap ONLY FROM YOUR NECK DOWN TO YOUR TOES (washing  for 3-5 minutes)  DO NOT use CHG soap on face, private areas, open wounds, or sores.  Pay special attention to the area where your surgery is being performed.  If you are having back surgery, having someone wash your back for you may be helpful. Wait 2 minutes after CHG soap is applied, then you may rinse off the CHG soap.  Pat dry with a clean towel  Put on clean pajamas    Additional instructions for the day of surgery: DO NOT APPLY any lotions, deodorants, cologne, or perfumes.   Do not wear jewelry or makeup Do not wear nail polish, gel polish, artificial nails, or any other type of covering on natural nails (fingers and toes) Do not bring valuables to the hospital. [facility] is not responsible for valuables/personal belongings. Put on clean/comfortable clothes.  Please brush your teeth.  Ask your nurse before applying any prescription medications to the skin.

## 2023-06-05 ENCOUNTER — Other Ambulatory Visit: Payer: Self-pay

## 2023-06-05 ENCOUNTER — Ambulatory Visit (HOSPITAL_COMMUNITY)
Admission: RE | Admit: 2023-06-05 | Discharge: 2023-06-05 | Disposition: A | Discharge status: Home / Self Care | Source: Ambulatory Visit | Attending: Thoracic Surgery (Cardiothoracic Vascular Surgery) | Admitting: Thoracic Surgery (Cardiothoracic Vascular Surgery)

## 2023-06-05 ENCOUNTER — Encounter (HOSPITAL_COMMUNITY): Payer: Self-pay

## 2023-06-05 ENCOUNTER — Encounter (HOSPITAL_COMMUNITY)
Admission: RE | Admit: 2023-06-05 | Discharge: 2023-06-05 | Disposition: A | Discharge status: Home / Self Care | Source: Ambulatory Visit | Attending: Thoracic Surgery (Cardiothoracic Vascular Surgery) | Admitting: Thoracic Surgery (Cardiothoracic Vascular Surgery)

## 2023-06-05 VITALS — BP 115/88 | HR 74 | Temp 98.1°F | Resp 18 | Ht 70.0 in | Wt 120.7 lb

## 2023-06-05 DIAGNOSIS — Z01818 Encounter for other preprocedural examination: Secondary | ICD-10-CM | POA: Insufficient documentation

## 2023-06-05 DIAGNOSIS — R911 Solitary pulmonary nodule: Secondary | ICD-10-CM | POA: Insufficient documentation

## 2023-06-05 HISTORY — DX: Arthrodesis status: Z98.1

## 2023-06-05 HISTORY — DX: Chronic obstructive pulmonary disease, unspecified: J44.9

## 2023-06-05 LAB — URINALYSIS, ROUTINE W REFLEX MICROSCOPIC
Bilirubin Urine: NEGATIVE
Glucose, UA: NEGATIVE mg/dL
Hgb urine dipstick: NEGATIVE
Ketones, ur: NEGATIVE mg/dL
Leukocytes,Ua: NEGATIVE
Nitrite: NEGATIVE
Protein, ur: NEGATIVE mg/dL
Specific Gravity, Urine: 1.005 (ref 1.005–1.030)
pH: 5 (ref 5.0–8.0)

## 2023-06-05 LAB — CBC
HCT: 46.8 % (ref 39.0–52.0)
Hemoglobin: 15.7 g/dL (ref 13.0–17.0)
MCH: 33.5 pg (ref 26.0–34.0)
MCHC: 33.5 g/dL (ref 30.0–36.0)
MCV: 100 fL (ref 80.0–100.0)
Platelets: 259 10*3/uL (ref 150–400)
RBC: 4.68 MIL/uL (ref 4.22–5.81)
RDW: 12.5 % (ref 11.5–15.5)
WBC: 4.7 10*3/uL (ref 4.0–10.5)
nRBC: 0 % (ref 0.0–0.2)

## 2023-06-05 LAB — COMPREHENSIVE METABOLIC PANEL WITH GFR
ALT: 27 U/L (ref 0–44)
AST: 28 U/L (ref 15–41)
Albumin: 4.2 g/dL (ref 3.5–5.0)
Alkaline Phosphatase: 67 U/L (ref 38–126)
Anion gap: 9 (ref 5–15)
BUN: 17 mg/dL (ref 8–23)
CO2: 30 mmol/L (ref 22–32)
Calcium: 9.8 mg/dL (ref 8.9–10.3)
Chloride: 101 mmol/L (ref 98–111)
Creatinine, Ser: 0.96 mg/dL (ref 0.61–1.24)
GFR, Estimated: >60 mL/min (ref >60)
Glucose, Bld: 91 mg/dL (ref 70–99)
Potassium: 5 mmol/L (ref 3.5–5.1)
Sodium: 140 mmol/L (ref 135–145)
Total Bilirubin: 0.5 mg/dL (ref 0.0–1.2)
Total Protein: 6.8 g/dL (ref 6.5–8.1)

## 2023-06-05 LAB — PROTIME-INR
INR: 1 (ref 0.8–1.2)
Prothrombin Time: 13.2 s (ref 11.4–15.2)

## 2023-06-05 LAB — APTT: aPTT: 28 s (ref 24–36)

## 2023-06-05 LAB — SURGICAL PCR SCREEN
MRSA, PCR: NEGATIVE
Staphylococcus aureus: NEGATIVE

## 2023-06-05 NOTE — Progress Notes (Signed)
 PCP - Ricarda Challenger at [facility] Cardiologist - denies Pulmonologist - Cozetta Divers at [facility]   PPM/ICD - denies   Chest x-ray - 06/05/23 EKG - 06/05/23 Stress Test - denies ECHO - denies Cardiac Cath - denies  Sleep Study - denies CPAP - n/a  No DM  Last dose of GLP1 agonist-  n/a GLP1 instructions: n/a  Blood Thinner Instructions: n/a Aspirin Instructions: n/a  ERAS Protcol - NPO   COVID TEST- n/a   Anesthesia review:  no  Patient denies shortness of breath, fever, cough and chest pain at PAT appointment   All instructions explained to the patient, with a verbal understanding of the material. Patient agrees to go over the instructions while at home for a better understanding. Patient also instructed to self quarantine after being tested for COVID-19. The opportunity to ask questions was provided.

## 2023-06-06 ENCOUNTER — Encounter (HOSPITAL_COMMUNITY): Payer: Self-pay

## 2023-06-07 ENCOUNTER — Inpatient Hospital Stay (HOSPITAL_COMMUNITY): Admitting: Anesthesiology

## 2023-06-07 ENCOUNTER — Inpatient Hospital Stay (HOSPITAL_COMMUNITY)
Admission: RE | Admit: 2023-06-07 | Discharge: 2023-06-14 | DRG: 164 | Disposition: A | Discharge status: Home Health | Attending: Thoracic Surgery (Cardiothoracic Vascular Surgery) | Admitting: Thoracic Surgery (Cardiothoracic Vascular Surgery)

## 2023-06-07 ENCOUNTER — Encounter (HOSPITAL_COMMUNITY): Payer: Self-pay | Admitting: Thoracic Surgery (Cardiothoracic Vascular Surgery)

## 2023-06-07 ENCOUNTER — Other Ambulatory Visit: Payer: Self-pay

## 2023-06-07 ENCOUNTER — Inpatient Hospital Stay (HOSPITAL_COMMUNITY): Admitting: Physician Assistant

## 2023-06-07 ENCOUNTER — Encounter (HOSPITAL_COMMUNITY)
Admission: RE | Disposition: A | Discharge status: Home Health | Payer: Self-pay | Source: Home / Self Care | Attending: Thoracic Surgery (Cardiothoracic Vascular Surgery)

## 2023-06-07 ENCOUNTER — Inpatient Hospital Stay (HOSPITAL_COMMUNITY)

## 2023-06-07 DIAGNOSIS — R918 Other nonspecific abnormal finding of lung field: Secondary | ICD-10-CM

## 2023-06-07 DIAGNOSIS — Z882 Allergy status to sulfonamides status: Secondary | ICD-10-CM

## 2023-06-07 DIAGNOSIS — Z902 Acquired absence of lung [part of]: Principal | ICD-10-CM

## 2023-06-07 DIAGNOSIS — Z880 Allergy status to penicillin: Secondary | ICD-10-CM | POA: Diagnosis not present

## 2023-06-07 DIAGNOSIS — D62 Acute posthemorrhagic anemia: Secondary | ICD-10-CM | POA: Diagnosis not present

## 2023-06-07 DIAGNOSIS — F17210 Nicotine dependence, cigarettes, uncomplicated: Secondary | ICD-10-CM

## 2023-06-07 DIAGNOSIS — Z888 Allergy status to other drugs, medicaments and biological substances status: Secondary | ICD-10-CM | POA: Diagnosis not present

## 2023-06-07 DIAGNOSIS — R64 Cachexia: Secondary | ICD-10-CM | POA: Diagnosis present

## 2023-06-07 DIAGNOSIS — F1120 Opioid dependence, uncomplicated: Secondary | ICD-10-CM | POA: Diagnosis present

## 2023-06-07 DIAGNOSIS — M549 Dorsalgia, unspecified: Secondary | ICD-10-CM | POA: Diagnosis present

## 2023-06-07 DIAGNOSIS — I700 Atherosclerosis of aorta: Secondary | ICD-10-CM | POA: Diagnosis present

## 2023-06-07 DIAGNOSIS — M542 Cervicalgia: Secondary | ICD-10-CM | POA: Diagnosis present

## 2023-06-07 DIAGNOSIS — J439 Emphysema, unspecified: Secondary | ICD-10-CM | POA: Diagnosis present

## 2023-06-07 DIAGNOSIS — Z809 Family history of malignant neoplasm, unspecified: Secondary | ICD-10-CM | POA: Diagnosis not present

## 2023-06-07 DIAGNOSIS — R911 Solitary pulmonary nodule: Secondary | ICD-10-CM

## 2023-06-07 DIAGNOSIS — M1990 Unspecified osteoarthritis, unspecified site: Secondary | ICD-10-CM | POA: Diagnosis present

## 2023-06-07 DIAGNOSIS — Z981 Arthrodesis status: Secondary | ICD-10-CM | POA: Diagnosis not present

## 2023-06-07 DIAGNOSIS — I97191 Other postprocedural cardiac functional disturbances following other surgery: Secondary | ICD-10-CM | POA: Diagnosis not present

## 2023-06-07 DIAGNOSIS — Z91018 Allergy to other foods: Secondary | ICD-10-CM

## 2023-06-07 DIAGNOSIS — J449 Chronic obstructive pulmonary disease, unspecified: Secondary | ICD-10-CM | POA: Diagnosis not present

## 2023-06-07 DIAGNOSIS — K5900 Constipation, unspecified: Secondary | ICD-10-CM | POA: Diagnosis not present

## 2023-06-07 DIAGNOSIS — I10 Essential (primary) hypertension: Secondary | ICD-10-CM

## 2023-06-07 DIAGNOSIS — G8929 Other chronic pain: Secondary | ICD-10-CM | POA: Diagnosis present

## 2023-06-07 DIAGNOSIS — C3432 Malignant neoplasm of lower lobe, left bronchus or lung: Principal | ICD-10-CM | POA: Diagnosis present

## 2023-06-07 DIAGNOSIS — J95812 Postprocedural air leak: Secondary | ICD-10-CM | POA: Diagnosis not present

## 2023-06-07 DIAGNOSIS — Z681 Body mass index (BMI) 19 or less, adult: Secondary | ICD-10-CM | POA: Diagnosis not present

## 2023-06-07 DIAGNOSIS — I959 Hypotension, unspecified: Secondary | ICD-10-CM | POA: Diagnosis not present

## 2023-06-07 HISTORY — PX: LOBECTOMY, LUNG, ROBOT-ASSISTED, USING VATS: SHX7607

## 2023-06-07 HISTORY — PX: INTERCOSTAL NERVE BLOCK: SHX5021

## 2023-06-07 HISTORY — PX: SENTINEL NODE BIOPSY: SHX6608

## 2023-06-07 LAB — TYPE AND SCREEN
ABO/RH(D): A POS
Antibody Screen: NEGATIVE
Unit division: 0
Unit division: 0
Unit division: 0
Unit division: 0

## 2023-06-07 LAB — BPAM RBC
Blood Product Expiration Date: 202507022359
Blood Product Expiration Date: 202507022359
Blood Product Expiration Date: 202507022359
Blood Product Expiration Date: 202507042359
Unit Type and Rh: 6200
Unit Type and Rh: 6200
Unit Type and Rh: 6200
Unit Type and Rh: 6200

## 2023-06-07 LAB — POCT I-STAT 7, (LYTES, BLD GAS, ICA,H+H)
Acid-base deficit: 1 mmol/L (ref 0.0–2.0)
Bicarbonate: 25.4 mmol/L (ref 20.0–28.0)
Calcium, Ion: 1.22 mmol/L (ref 1.15–1.40)
HCT: 37 % — ABNORMAL LOW (ref 39.0–52.0)
Hemoglobin: 12.6 g/dL — ABNORMAL LOW (ref 13.0–17.0)
O2 Saturation: 100 %
Potassium: 4 mmol/L (ref 3.5–5.1)
Sodium: 141 mmol/L (ref 135–145)
TCO2: 27 mmol/L (ref 22–32)
pCO2 arterial: 46.6 mmHg (ref 32–48)
pH, Arterial: 7.344 — ABNORMAL LOW (ref 7.35–7.45)
pO2, Arterial: 226 mmHg — ABNORMAL HIGH (ref 83–108)

## 2023-06-07 LAB — PREPARE RBC (CROSSMATCH)

## 2023-06-07 LAB — ABO/RH: ABO/RH(D): A POS

## 2023-06-07 SURGERY — LOBECTOMY, LUNG, ROBOT-ASSISTED, USING VATS
Anesthesia: General | Site: Chest | Laterality: Left

## 2023-06-07 MED ORDER — FENTANYL CITRATE (PF) 100 MCG/2ML IJ SOLN
INTRAMUSCULAR | Status: AC
Start: 2023-06-07 — End: ?
  Filled 2023-06-07: qty 2

## 2023-06-07 MED ORDER — ALBUTEROL SULFATE HFA 108 (90 BASE) MCG/ACT IN AERS
INHALATION_SPRAY | RESPIRATORY_TRACT | Status: DC | PRN
Start: 2023-06-07 — End: 2023-06-07
  Administered 2023-06-07: 6 via RESPIRATORY_TRACT

## 2023-06-07 MED ORDER — SODIUM CHLORIDE 0.45 % IV SOLN
INTRAVENOUS | Status: AC
Start: 2023-06-07 — End: 2023-06-08

## 2023-06-07 MED ORDER — GABAPENTIN 300 MG PO CAPS
300.0000 mg | ORAL_CAPSULE | Freq: Two times a day (BID) | ORAL | Status: DC
  Administered 2023-06-10 – 2023-06-14 (×8): 300 mg via ORAL
  Filled 2023-06-07 (×8): qty 1

## 2023-06-07 MED ORDER — FENTANYL CITRATE (PF) 100 MCG/2ML IJ SOLN
25.0000 ug | INTRAMUSCULAR | Status: DC | PRN
  Administered 2023-06-07 (×2): 50 ug via INTRAVENOUS

## 2023-06-07 MED ORDER — PHENYLEPHRINE HCL-NACL 20-0.9 MG/250ML-% IV SOLN
INTRAVENOUS | Status: DC | PRN
  Administered 2023-06-07: 40 ug/min via INTRAVENOUS

## 2023-06-07 MED ORDER — PHENYLEPHRINE 80 MCG/ML (10ML) SYRINGE FOR IV PUSH (FOR BLOOD PRESSURE SUPPORT)
PREFILLED_SYRINGE | INTRAVENOUS | Status: DC | PRN
  Administered 2023-06-07 (×4): 80 ug via INTRAVENOUS
  Administered 2023-06-07: 120 ug via INTRAVENOUS

## 2023-06-07 MED ORDER — ALBUTEROL SULFATE (2.5 MG/3ML) 0.083% IN NEBU
2.5000 mg | INHALATION_SOLUTION | Freq: Four times a day (QID) | RESPIRATORY_TRACT | Status: DC | PRN
  Administered 2023-06-13: 2.5 mg via RESPIRATORY_TRACT
  Filled 2023-06-07: qty 3

## 2023-06-07 MED ORDER — VITAMIN C 500 MG PO TABS
1000.0000 mg | ORAL_TABLET | Freq: Every day | ORAL | Status: DC
  Administered 2023-06-08 – 2023-06-14 (×7): 1000 mg via ORAL
  Filled 2023-06-07 (×7): qty 2

## 2023-06-07 MED ORDER — BUPIVACAINE HCL (PF) 0.5 % IJ SOLN
INTRAMUSCULAR | Status: AC
  Filled 2023-06-07: qty 30

## 2023-06-07 MED ORDER — MIDAZOLAM HCL 2 MG/2ML IJ SOLN
INTRAMUSCULAR | Status: DC | PRN
  Administered 2023-06-07: 2 mg via INTRAVENOUS

## 2023-06-07 MED ORDER — ONDANSETRON HCL 4 MG/2ML IJ SOLN
INTRAMUSCULAR | Status: AC
  Filled 2023-06-07: qty 2

## 2023-06-07 MED ORDER — GABAPENTIN 300 MG PO CAPS
300.0000 mg | ORAL_CAPSULE | Freq: Every day | ORAL | Status: AC
  Administered 2023-06-07 – 2023-06-09 (×3): 300 mg via ORAL
  Filled 2023-06-07 (×3): qty 1

## 2023-06-07 MED ORDER — ALBUMIN HUMAN 5 % IV SOLN
INTRAVENOUS | Status: AC
  Filled 2023-06-07: qty 250

## 2023-06-07 MED ORDER — CHLORHEXIDINE GLUCONATE 0.12 % MT SOLN
15.0000 mL | Freq: Once | OROMUCOSAL | Status: AC
  Administered 2023-06-07: 15 mL via OROMUCOSAL
  Filled 2023-06-07: qty 15

## 2023-06-07 MED ORDER — LACTATED RINGERS IV SOLN: INTRAVENOUS | Status: DC

## 2023-06-07 MED ORDER — CHLORHEXIDINE GLUCONATE 0.12 % MT SOLN: 15.0000 mL | Freq: Once | OROMUCOSAL | Status: DC

## 2023-06-07 MED ORDER — ACETAMINOPHEN 500 MG PO TABS
1000.0000 mg | ORAL_TABLET | Freq: Four times a day (QID) | ORAL | Status: AC
  Administered 2023-06-07 – 2023-06-12 (×18): 1000 mg via ORAL
  Filled 2023-06-07 (×19): qty 2

## 2023-06-07 MED ORDER — ALBUMIN HUMAN 5 % IV SOLN
12.5000 g | Freq: Once | INTRAVENOUS | Status: AC
  Administered 2023-06-07: 12.5 g via INTRAVENOUS

## 2023-06-07 MED ORDER — BACLOFEN 10 MG PO TABS
10.0000 mg | ORAL_TABLET | Freq: Every day | ORAL | Status: DC
  Administered 2023-06-08 – 2023-06-14 (×7): 10 mg via ORAL
  Filled 2023-06-07 (×7): qty 1

## 2023-06-07 MED ORDER — SODIUM CHLORIDE 0.9% IV SOLUTION: Freq: Once | INTRAVENOUS | Status: DC

## 2023-06-07 MED ORDER — SENNOSIDES-DOCUSATE SODIUM 8.6-50 MG PO TABS
1.0000 | ORAL_TABLET | Freq: Every day | ORAL | Status: DC
  Administered 2023-06-07 – 2023-06-13 (×7): 1 via ORAL
  Filled 2023-06-07 (×7): qty 1

## 2023-06-07 MED ORDER — ALBUTEROL SULFATE HFA 108 (90 BASE) MCG/ACT IN AERS: 2.0000 | INHALATION_SPRAY | Freq: Four times a day (QID) | RESPIRATORY_TRACT | Status: DC | PRN

## 2023-06-07 MED ORDER — DIAZEPAM 5 MG PO TABS
10.0000 mg | ORAL_TABLET | Freq: Three times a day (TID) | ORAL | Status: DC | PRN
  Administered 2023-06-08 – 2023-06-14 (×15): 10 mg via ORAL
  Filled 2023-06-07 (×15): qty 2

## 2023-06-07 MED ORDER — ONDANSETRON HCL 4 MG/2ML IJ SOLN: 4.0000 mg | Freq: Four times a day (QID) | INTRAMUSCULAR | Status: DC | PRN

## 2023-06-07 MED ORDER — FENTANYL CITRATE (PF) 250 MCG/5ML IJ SOLN
INTRAMUSCULAR | Status: AC
  Filled 2023-06-07: qty 5

## 2023-06-07 MED ORDER — KETAMINE HCL 10 MG/ML IJ SOLN
INTRAMUSCULAR | Status: DC | PRN
  Administered 2023-06-07: 30 mg via INTRAVENOUS

## 2023-06-07 MED ORDER — VITAMIN D 25 MCG (1000 UNIT) PO TABS
2000.0000 [IU] | ORAL_TABLET | Freq: Every day | ORAL | Status: DC
  Administered 2023-06-08 – 2023-06-14 (×7): 2000 [IU] via ORAL
  Filled 2023-06-07 (×8): qty 2

## 2023-06-07 MED ORDER — ROCURONIUM BROMIDE 10 MG/ML (PF) SYRINGE
PREFILLED_SYRINGE | INTRAVENOUS | Status: DC | PRN
  Administered 2023-06-07 (×2): 20 mg via INTRAVENOUS
  Administered 2023-06-07: 60 mg via INTRAVENOUS

## 2023-06-07 MED ORDER — ACETAMINOPHEN 10 MG/ML IV SOLN: 1000.0000 mg | Freq: Once | INTRAVENOUS | Status: DC | PRN

## 2023-06-07 MED ORDER — KETAMINE HCL 50 MG/5ML IJ SOSY
PREFILLED_SYRINGE | INTRAMUSCULAR | Status: AC
  Filled 2023-06-07: qty 5

## 2023-06-07 MED ORDER — DEXAMETHASONE SODIUM PHOSPHATE 10 MG/ML IJ SOLN
INTRAMUSCULAR | Status: DC | PRN
  Administered 2023-06-07: 10 mg via INTRAVENOUS

## 2023-06-07 MED ORDER — FENTANYL CITRATE PF 50 MCG/ML IJ SOSY
25.0000 ug | PREFILLED_SYRINGE | INTRAMUSCULAR | Status: DC | PRN
  Administered 2023-06-08: 25 ug via INTRAVENOUS
  Administered 2023-06-08 – 2023-06-13 (×12): 50 ug via INTRAVENOUS
  Filled 2023-06-07 (×13): qty 1

## 2023-06-07 MED ORDER — FENTANYL CITRATE (PF) 250 MCG/5ML IJ SOLN
INTRAMUSCULAR | Status: DC | PRN
  Administered 2023-06-07 (×5): 50 ug via INTRAVENOUS

## 2023-06-07 MED ORDER — SUGAMMADEX SODIUM 200 MG/2ML IV SOLN
INTRAVENOUS | Status: DC | PRN
  Administered 2023-06-07: 109.4 mg via INTRAVENOUS

## 2023-06-07 MED ORDER — LIDOCAINE 2% (20 MG/ML) 5 ML SYRINGE
INTRAMUSCULAR | Status: AC
  Filled 2023-06-07: qty 5

## 2023-06-07 MED ORDER — KETOROLAC TROMETHAMINE 15 MG/ML IJ SOLN
15.0000 mg | Freq: Four times a day (QID) | INTRAMUSCULAR | Status: AC
  Administered 2023-06-07 – 2023-06-09 (×8): 15 mg via INTRAVENOUS
  Filled 2023-06-07 (×8): qty 1

## 2023-06-07 MED ORDER — PANTOPRAZOLE SODIUM 40 MG PO TBEC
40.0000 mg | DELAYED_RELEASE_TABLET | Freq: Every day | ORAL | Status: DC
  Administered 2023-06-08 – 2023-06-14 (×7): 40 mg via ORAL
  Filled 2023-06-07 (×7): qty 1

## 2023-06-07 MED ORDER — MIDAZOLAM HCL 2 MG/2ML IJ SOLN
INTRAMUSCULAR | Status: AC
  Filled 2023-06-07: qty 2

## 2023-06-07 MED ORDER — OXYCODONE HCL 5 MG PO TABS
10.0000 mg | ORAL_TABLET | ORAL | Status: DC | PRN
  Administered 2023-06-07: 15 mg via ORAL
  Administered 2023-06-08 (×3): 10 mg via ORAL
  Administered 2023-06-08 – 2023-06-09 (×4): 15 mg via ORAL
  Administered 2023-06-09: 10 mg via ORAL
  Administered 2023-06-09 – 2023-06-14 (×25): 15 mg via ORAL
  Filled 2023-06-07 (×7): qty 3
  Filled 2023-06-07: qty 2
  Filled 2023-06-07 (×3): qty 3
  Filled 2023-06-07: qty 2
  Filled 2023-06-07 (×7): qty 3
  Filled 2023-06-07: qty 2
  Filled 2023-06-07 (×5): qty 3
  Filled 2023-06-07: qty 2
  Filled 2023-06-07 (×7): qty 3
  Filled 2023-06-07: qty 2
  Filled 2023-06-07 (×2): qty 3

## 2023-06-07 MED ORDER — BISACODYL 5 MG PO TBEC
10.0000 mg | DELAYED_RELEASE_TABLET | Freq: Every day | ORAL | Status: DC
  Administered 2023-06-07 – 2023-06-14 (×8): 10 mg via ORAL
  Filled 2023-06-07 (×8): qty 2

## 2023-06-07 MED ORDER — 0.9 % SODIUM CHLORIDE (POUR BTL) OPTIME
TOPICAL | Status: DC | PRN
  Administered 2023-06-07: 2000 mL

## 2023-06-07 MED ORDER — VANCOMYCIN HCL IN DEXTROSE 1-5 GM/200ML-% IV SOLN
1000.0000 mg | Freq: Two times a day (BID) | INTRAVENOUS | Status: AC
  Administered 2023-06-08: 1000 mg via INTRAVENOUS
  Filled 2023-06-07: qty 200

## 2023-06-07 MED ORDER — ONDANSETRON HCL 4 MG/2ML IJ SOLN
INTRAMUSCULAR | Status: DC | PRN
  Administered 2023-06-07: 4 mg via INTRAVENOUS

## 2023-06-07 MED ORDER — ONDANSETRON HCL 4 MG/2ML IJ SOLN
INTRAMUSCULAR | Status: AC
Start: 2023-06-07 — End: ?
  Filled 2023-06-07: qty 2

## 2023-06-07 MED ORDER — NICOTINE 14 MG/24HR TD PT24: 14.0000 mg | MEDICATED_PATCH | Freq: Every day | TRANSDERMAL | Status: DC | PRN

## 2023-06-07 MED ORDER — ALBUMIN HUMAN 5 % IV SOLN
INTRAVENOUS | Status: DC | PRN
Start: 2023-06-07 — End: 2023-06-07

## 2023-06-07 MED ORDER — PROPOFOL 10 MG/ML IV BOLUS
INTRAVENOUS | Status: AC
  Filled 2023-06-07: qty 20

## 2023-06-07 MED ORDER — ACETAMINOPHEN 10 MG/ML IV SOLN
INTRAVENOUS | Status: AC
  Filled 2023-06-07: qty 100

## 2023-06-07 MED ORDER — ROCURONIUM BROMIDE 10 MG/ML (PF) SYRINGE
PREFILLED_SYRINGE | INTRAVENOUS | Status: AC
  Filled 2023-06-07: qty 10

## 2023-06-07 MED ORDER — DEXAMETHASONE SODIUM PHOSPHATE 10 MG/ML IJ SOLN
INTRAMUSCULAR | Status: AC
Start: 2023-06-07 — End: ?
  Filled 2023-06-07: qty 1

## 2023-06-07 MED ORDER — DEXAMETHASONE SODIUM PHOSPHATE 10 MG/ML IJ SOLN
INTRAMUSCULAR | Status: AC
  Filled 2023-06-07: qty 1

## 2023-06-07 MED ORDER — ENSURE PLUS HIGH PROTEIN PO LIQD
237.0000 mL | Freq: Two times a day (BID) | ORAL | Status: DC
  Administered 2023-06-08 – 2023-06-14 (×12): 237 mL via ORAL

## 2023-06-07 MED ORDER — SODIUM CHLORIDE (PF) 0.9 % IJ SOLN
INTRAMUSCULAR | Status: AC
Start: 2023-06-07 — End: ?
  Filled 2023-06-07: qty 50

## 2023-06-07 MED ORDER — VANCOMYCIN HCL IN DEXTROSE 1-5 GM/200ML-% IV SOLN
1000.0000 mg | INTRAVENOUS | Status: AC
  Administered 2023-06-07: 1000 mg via INTRAVENOUS
  Filled 2023-06-07: qty 200

## 2023-06-07 MED ORDER — SODIUM CHLORIDE 0.9% IV SOLUTION
INTRAVENOUS | Status: AC | PRN
  Administered 2023-06-07: 1000 mL via INTRAMUSCULAR

## 2023-06-07 MED ORDER — ACETAMINOPHEN 10 MG/ML IV SOLN
INTRAVENOUS | Status: DC | PRN
  Administered 2023-06-07: 1000 mg via INTRAVENOUS

## 2023-06-07 MED ORDER — ENOXAPARIN SODIUM 40 MG/0.4ML IJ SOSY
40.0000 mg | PREFILLED_SYRINGE | Freq: Every day | INTRAMUSCULAR | Status: DC
  Administered 2023-06-08 – 2023-06-14 (×7): 40 mg via SUBCUTANEOUS
  Filled 2023-06-07 (×7): qty 0.4

## 2023-06-07 MED ORDER — SODIUM CHLORIDE FLUSH 0.9 % IV SOLN: INTRAVENOUS | Status: DC | PRN

## 2023-06-07 MED ORDER — ACETAMINOPHEN 160 MG/5ML PO SOLN: 1000.0000 mg | Freq: Four times a day (QID) | ORAL | Status: AC

## 2023-06-07 MED ORDER — METOCLOPRAMIDE HCL 5 MG/ML IJ SOLN
10.0000 mg | Freq: Four times a day (QID) | INTRAMUSCULAR | Status: AC
  Administered 2023-06-07 – 2023-06-08 (×4): 10 mg via INTRAVENOUS
  Filled 2023-06-07 (×4): qty 2

## 2023-06-07 MED ORDER — ORAL CARE MOUTH RINSE: 15.0000 mL | Freq: Once | OROMUCOSAL | Status: AC

## 2023-06-07 MED ORDER — ORAL CARE MOUTH RINSE: 15.0000 mL | Freq: Once | OROMUCOSAL | Status: DC

## 2023-06-07 MED ORDER — LIDOCAINE 2% (20 MG/ML) 5 ML SYRINGE
INTRAMUSCULAR | Status: DC | PRN
  Administered 2023-06-07: 60 mg via INTRAVENOUS

## 2023-06-07 MED ORDER — HEMOSTATIC AGENTS (NO CHARGE) OPTIME
TOPICAL | Status: DC | PRN
  Administered 2023-06-07: 1 via TOPICAL

## 2023-06-07 MED ORDER — PROPOFOL 10 MG/ML IV BOLUS
INTRAVENOUS | Status: DC | PRN
  Administered 2023-06-07: 120 mg via INTRAVENOUS

## 2023-06-07 MED ORDER — BUPIVACAINE LIPOSOME 1.3 % IJ SUSP
INTRAMUSCULAR | Status: AC
Start: 2023-06-07 — End: ?
  Filled 2023-06-07: qty 20

## 2023-06-07 SURGICAL SUPPLY — 70 items
BLADE CLIPPER SURG (BLADE) IMPLANT
CANISTER SUCTION 3000ML PPV (SUCTIONS) ×2 IMPLANT
CANNULA REDUCER 12-8 DVNC XI (CANNULA) ×2 IMPLANT
CATH THORACIC 28FR (CATHETERS) IMPLANT
CNTNR URN SCR LID CUP LEK RST (MISCELLANEOUS) ×5 IMPLANT
CONN ST 1/4X3/8 BEN (MISCELLANEOUS) IMPLANT
DEFOGGER SCOPE WARM SEASHARP (MISCELLANEOUS) ×1 IMPLANT
DERMABOND ADVANCED .7 DNX12 (GAUZE/BANDAGES/DRESSINGS) ×1 IMPLANT
DRAIN CHANNEL 28F RND 3/8 FF (WOUND CARE) IMPLANT
DRAPE ARM DVNC X/XI (DISPOSABLE) ×4 IMPLANT
DRAPE COLUMN DVNC XI (DISPOSABLE) ×1 IMPLANT
DRAPE CV SPLIT W-CLR ANES SCRN (DRAPES) ×1 IMPLANT
DRAPE HALF SHEET 40X57 (DRAPES) ×1 IMPLANT
DRAPE INCISE IOBAN 66X45 STRL (DRAPES) IMPLANT
DRAPE SURG ORHT 6 SPLT 77X108 (DRAPES) ×1 IMPLANT
ELECT BLADE 6.5 EXT (BLADE) ×1 IMPLANT
ELECTRODE REM PT RTRN 9FT ADLT (ELECTROSURGICAL) ×1 IMPLANT
FORCEPS BPLR FENES DVNC XI (FORCEP) IMPLANT
FORCEPS BPLR LNG DVNC XI (INSTRUMENTS) IMPLANT
GAUZE KITTNER 4X5 RF (MISCELLANEOUS) ×2 IMPLANT
GAUZE SPONGE 4X4 12PLY STRL (GAUZE/BANDAGES/DRESSINGS) ×1 IMPLANT
GLOVE SS BIOGEL STRL SZ 7.5 (GLOVE) ×2 IMPLANT
GLOVE SURG POLYISO LF SZ8 (GLOVE) ×1 IMPLANT
GOWN STRL REUS W/ TWL LRG LVL3 (GOWN DISPOSABLE) ×2 IMPLANT
GOWN STRL REUS W/ TWL XL LVL3 (GOWN DISPOSABLE) ×2 IMPLANT
GOWN STRL REUS W/TWL 2XL LVL3 (GOWN DISPOSABLE) ×1 IMPLANT
GRASPER TIP-UP FEN DVNC XI (INSTRUMENTS) IMPLANT
HEMOSTAT SURGICEL 2X14 (HEMOSTASIS) ×3 IMPLANT
IRRIGATION STRYKERFLOW (MISCELLANEOUS) ×1 IMPLANT
KIT BASIN OR (CUSTOM PROCEDURE TRAY) ×1 IMPLANT
KIT SUCTION CATH 14FR (SUCTIONS) IMPLANT
KIT TURNOVER KIT B (KITS) ×1 IMPLANT
NDL HYPO 25GX1X1/2 BEV (NEEDLE) ×1 IMPLANT
NDL SPNL 22GX3.5 QUINCKE BK (NEEDLE) ×1 IMPLANT
NEEDLE HYPO 25GX1X1/2 BEV (NEEDLE) ×1 IMPLANT
NEEDLE SPNL 22GX3.5 QUINCKE BK (NEEDLE) ×1 IMPLANT
NS IRRIG 1000ML POUR BTL (IV SOLUTION) ×2 IMPLANT
PACK CHEST (CUSTOM PROCEDURE TRAY) ×1 IMPLANT
PAD ARMBOARD POSITIONER FOAM (MISCELLANEOUS) ×2 IMPLANT
PORT ACCESS TROCAR AIRSEAL 12 (TROCAR) ×1 IMPLANT
RELOAD STAPLE 45 2.5 WHT DVNC (STAPLE) IMPLANT
RELOAD STAPLE 45 3.5 BLU DVNC (STAPLE) IMPLANT
RELOAD STAPLE 45 4.3 GRN DVNC (STAPLE) IMPLANT
RELOAD STAPLER 2.5X45 WHT DVNC (STAPLE) ×2 IMPLANT
RELOAD STAPLER 3.5X45 BLU DVNC (STAPLE) ×2 IMPLANT
RELOAD STAPLER 4.3X45 GRN DVNC (STAPLE) ×1 IMPLANT
SCISSORS LAP 5X35 DISP (ENDOMECHANICALS) IMPLANT
SEAL UNIV 5-12 XI (MISCELLANEOUS) ×4 IMPLANT
SET TRI-LUMEN FLTR TB AIRSEAL (TUBING) ×1 IMPLANT
SOLUTION ELECTROSURG ANTI STCK (MISCELLANEOUS) ×1 IMPLANT
SPONGE INTESTINAL PEANUT (DISPOSABLE) IMPLANT
SPONGE TONSIL 1 RF SGL (DISPOSABLE) IMPLANT
STAPLER 45 SUREFORM CVD DVNC (STAPLE) IMPLANT
SUT PROLENE 4-0 RB1 .5 CRCL 36 (SUTURE) IMPLANT
SUT SILK 1 MH (SUTURE) ×1 IMPLANT
SUT SILK 2 0 SH (SUTURE) ×1 IMPLANT
SUT SILK 2 0SH CR/8 30 (SUTURE) IMPLANT
SUT SILK 3 0SH CR/8 30 (SUTURE) IMPLANT
SUT VIC AB 1 CTX36XBRD ANBCTR (SUTURE) ×1 IMPLANT
SUT VIC AB 2-0 CTX 36 (SUTURE) ×1 IMPLANT
SUT VIC AB 3-0 X1 27 (SUTURE) ×2 IMPLANT
SUT VICRYL 0 TIES 12 18 (SUTURE) ×1 IMPLANT
SUT VICRYL 0 UR6 27IN ABS (SUTURE) ×2 IMPLANT
SYR 20CC LL (SYRINGE) ×2 IMPLANT
SYSTEM RETRIEVAL ANCHOR 15 (MISCELLANEOUS) IMPLANT
SYSTEM SAHARA CHEST DRAIN ATS (WOUND CARE) ×1 IMPLANT
TAPE CLOTH 4X10 WHT NS (GAUZE/BANDAGES/DRESSINGS) ×1 IMPLANT
TOWEL GREEN STERILE (TOWEL DISPOSABLE) ×1 IMPLANT
TRAY FOLEY MTR SLVR 16FR STAT (SET/KITS/TRAYS/PACK) ×1 IMPLANT
WATER STERILE IRR 1000ML POUR (IV SOLUTION) ×2 IMPLANT

## 2023-06-07 NOTE — Discharge Instructions (Signed)
Discharge Instructions:  1. You may shower, please wash incisions daily with soap and water and keep dry.  If you wish to cover wounds with dressing you may do so but please keep clean and change daily.  No tub baths or swimming until incisions have completely healed.  If your incisions become red or develop any drainage please call our office at [phone]  2. No Driving until cleared by Dr. Leonarda Salon office and you are no longer using narcotic pain medications  3. Fever of 101.5 for at least 24 hours with no source, please contact our office at [phone]  4. Activity- up as tolerated, please walk at least 3 times per day.  5. If any questions or concerns arise, please do not hesitate to contact our office at [phone]

## 2023-06-07 NOTE — Anesthesia Procedure Notes (Signed)
 Arterial Line Insertion Start/End6/05/2023 10:50 AM, 06/07/2023 10:55 AM Performed by: Micheal Agent, DO, Bennett Brass, CRNA, CRNA  Patient location: Pre-op. Preanesthetic checklist: patient identified, IV checked, site marked, risks and benefits discussed, surgical consent, monitors and equipment checked, pre-op evaluation, timeout performed and anesthesia consent Lidocaine  1% used for infiltration Right, radial was placed Catheter size: 20 G Hand hygiene performed  and maximum sterile barriers used   Attempts: 1 Procedure performed without using ultrasound guided technique. Following insertion, dressing applied. Post procedure assessment: normal and unchanged  Patient tolerated the procedure well with no immediate complications.

## 2023-06-07 NOTE — Discharge Summary (Addendum)
 Physician Discharge Summary  Patient ID: Miguel Gutierrez MRN: [id number] DOB/AGE: [birth year] 63 y.o.  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   He had a low-dose CT for lung cancer screening which found a left lower lobe lung nodule.  He then had a PET/CT in January 2025 which showed a 2 cm subsolid nodule in the left lower lobe that was hypermetabolic with an SUV of 5.  There was no mediastinal or hilar adenopathy.  He was evaluated by Dr.  Luna Salinas at which time he admitted to continued tobacco abuse.  He is trying to quit and was down to 0.5 ppd.  The patient is short of breath with exertion and at rest.  This relieves with use of bronchodilators.  He has chronic pain which renders him disabled.  He was informed that he very likely has a primary new lung cancer.  It was recommended the patient could undergo surgical resection vs. Radiation therapy.  The patient had family members with bad radiation experience and he did not wish to be evaluated by radiation oncology.  He wished to undergo surgical resection.  He was counseled that he has enough lung capacity to tolerate a lobectomy, however he will have a significant reduction in capacity post surgery.  The risks and benefits of the procedure were explained to the patient  and he was agreeable to proceed.  Hospital Course:  Miguel Gutierrez presented to [facility] on 06/07/2023.  He was taken to the operating room and underwent Robotic Assisted Left Video Thoracoscopy with Left Lower Lobectomy, Lymph node dissection, and intercostal nerve block.  He tolerated the procedure without difficulty, was extubated, and taken to the PACU in stable condition. He had a soft blood pressure and was given albumin . He had moderate chest tube drainage and an air leak on exam, chest tube was placed to suction 24 hours beginning on postop day 1. CXR showed subcutaneous emphysema of the left side and a left apical space.  And this all remained stable.  The chest tube was placed back to waterseal on postop day 2 with no change in the subcu air or small pneumothorax.  The airleak continued but appeared to be slowing by postop day 3.  Chest tube drainage also diminished to 100 mL per 12 hours by postop day 3.  He had expected acute postoperative blood loss anemia that was closely monitored and remained stable.  Patient has long standing chronic pain and had issues with pain control post operatively. He had a small intermittent air leak that resolved on 06/10. CXR showed stable left sided subcutaneous emphysema and an improved left apical space. He remains stable and was saturating well on room air. Chest tube was removed on 06/10 and follow up CXR showed stable tiny left apical pneumothorax stable subcutaneous emphysema. He went into atrial fibrillation with RVR and  was started on Amiodarone  gtt protocol, he converted to NSR. IV Amiodarone  was transitioned to PO Amiodarone  on 06/11 and he remained in NSR. He was ambulating well on RA. PT/OT recommended SNF at discharge since the patient lives alone but the patient refused SNF. Home health PT/OT was arranged instead. His incisions were healing well without sign of infection. He was felt stable for discharge home.      Consults: None  Significant  Diagnostic Studies: nuclear medicine:   FINDINGS: Mediastinal blood pool activity: SUV max 1.54   Liver activity: SUV max NA   NECK: No hypermetabolic lymph nodes in the neck.   Incidental CT findings: None.   CHEST: Left lower lobe complex sub solid 2 cm lesion demonstrates hypermetabolism with SUV max of 5.0. Findings consistent with a primary lung neoplasm. No enlarged or hypermetabolic mediastinal or hilar lymph nodes. No hypermetabolic chest wall lesions, supraclavicular or axillary adenopathy.   No other pulmonary lesions or pulmonary nodules are identified on the CT scan.   Incidental CT findings: Advanced emphysematous changes and areas of pulmonary scarring. The central tracheobronchial tree is unremarkable. Age advanced aortic and three-vessel coronary artery calcifications.   ABDOMEN/PELVIS: No abnormal hypermetabolic activity within the liver, pancreas, adrenal glands, or spleen. No hypermetabolic lymph nodes in the abdomen or pelvis.   Incidental CT findings: Simple left renal cysts not requiring any further imaging evaluation or follow-up. Age advanced atherosclerotic calcifications involving the aorta and iliac arteries but no aneurysm. Mild prostate gland enlargement.   SKELETON: No focal hypermetabolic activity to suggest skeletal metastasis.   Incidental CT findings: None.   IMPRESSION: 1. Hypermetabolic complex sub solid 2 cm lesion in the left lower lobe consistent with a primary lung neoplasm. 2. No findings for metastatic disease involving the neck, chest, abdomen/pelvis or bony structures. 3. Age advanced aortic and three-vessel coronary artery calcifications. 4. Advanced emphysematous changes. 5. Prostate gland enlargement.   Aortic Atherosclerosis (ICD10-I70.0) and Emphysema (ICD10-J43.9).     Electronically Signed   By: Marrian Siva M.D.   On: 01/27/2023 16:25  Treatments: surgery: Operative Report    DATE OF PROCEDURE: 06/07/2023    PREOPERATIVE DIAGNOSIS: Left lower lobe lung nodule.   POSTOPERATIVE DIAGNOSIS: Squamous cell carcinoma, left lower lobe, clinical stage IA (T1, N0).   PROCEDURE: Robotic-assisted left lower lobectomy, lymph node dissection, intercostal nerve blocks levels 3 through 10.   SURGEON: Milon Aloe. Luna Salinas, MD.   ASSISTANT:  1. Gates Kasal, PA,  2. Margo Shells, PA student.  PATHOLOGY: Pathologic Stage Classification (pTNM, AJCC 8th Edition): pT1b, pN0   Discharge Exam: Blood pressure (!) 92/59, pulse 90, temperature 98.7 F (37.1 C), temperature source Oral, resp. rate 20, height 5' 10 (1.778 m), weight 59.8 kg, SpO2 92%. General appearance: alert, cooperative, and no distress Neurologic: intact Heart: regular rate and rhythm, S1, S2 normal, no murmur, click, rub or gallop Lungs: clear to auscultation bilaterally Abdomen: soft, non-tender; bowel sounds normal; no masses,  no organomegaly Extremities: extremities normal, atraumatic, no cyanosis or edema Wound: Clean and dry without sign of infection, stable left sided subcutaneous emphysema  Disposition: Discharge disposition: 06-Home-Health Care Svc        Allergies as of 06/14/2023       Reactions   Penicillins Anaphylaxis   Spinach Anaphylaxis   Sulfa Antibiotics Anaphylaxis   Chantix [varenicline] Nausea Only   nightmares        Medication List     TAKE these medications    albuterol  108 (90  Base) MCG/ACT inhaler Commonly known as: VENTOLIN  HFA Inhale 2 puffs into the lungs every 6 (six) hours as needed for shortness of breath or wheezing.   amiodarone  200 MG tablet Commonly known as: PACERONE  Take 2 tablets twice per day for 7 days, then take 1 tablet twice per day for 7 days, then take 1 tablet daily thereafter   baclofen  10 MG tablet Commonly known as: LIORESAL  Take 10 mg by mouth daily.   diazepam  10 MG tablet Commonly known as: VALIUM  Take 10 mg by mouth 3 (three) times daily as needed for  anxiety.   gabapentin  300 MG capsule Commonly known as: NEURONTIN  Take 1 capsule (300 mg total) by mouth 2 (two) times daily. If you no longer feel you need this medication for neuropathic pain do not stop abruptly. Please decrease to 1 capsule daily for 1 week and then stop.   nicotine  14 mg/24hr patch Commonly known as: NICODERM CQ  - dosed in mg/24 hours Place 14 mg onto the skin daily as needed (nicotine  dependence).   oxyCODONE -acetaminophen  10-325 MG tablet Commonly known as: PERCOCET Take 1 tablet by mouth 4 (four) times daily as needed.   vitamin C  1000 MG tablet Take 1,000 mg by mouth daily.   Vitamin D3 50 MCG (2000 UT) capsule Take 2,000 Units by mouth daily.        Follow-up Information     Zelphia Higashi, MD Follow up on 06/20/2023.   Specialty: Cardiothoracic Surgery Why: Appointment is at 3:15, plesae get CXR at 2:45 prior to your appointment with Dr. Audree Bless office on the 2nd floor of our office building Contact information: [address] [place] [address] [phone]         Care, [facility] Follow up.   Specialty: Home Health Services Why: Home Health has been arranged. They will call you to schedule apt within 48hr post discharge. Contact information: [address] [place] [address] [phone]                 Signed: Randa Burton, PA-C  06/14/2023, 10:31 AM

## 2023-06-07 NOTE — Anesthesia Procedure Notes (Signed)
 Procedure Name: Intubation Date/Time: 06/07/2023 12:18 PM  Performed by: Raymund Calix, CRNAPre-anesthesia Checklist: Patient identified, Emergency Drugs available, Suction available and Patient being monitored Patient Re-evaluated:Patient Re-evaluated prior to induction Oxygen Delivery Method: Circle system utilized Preoxygenation: Pre-oxygenation with 100% oxygen Induction Type: IV induction Ventilation: Mask ventilation without difficulty and Oral airway inserted - appropriate to patient size Laryngoscope Size: Mac and 4 Grade View: Grade I Tube type: Oral Endobronchial tube: Left, Double lumen EBT and EBT position confirmed by auscultation and 39 Fr Number of attempts: 1 Airway Equipment and Method: Stylet and Oral airway Placement Confirmation: ETT inserted through vocal cords under direct vision, positive ETCO2 and breath sounds checked- equal and bilateral Tube secured with: Tape Dental Injury: Teeth and Oropharynx as per pre-operative assessment

## 2023-06-07 NOTE — Hospital Course (Addendum)
 History of Present Illness:  Miguel Gutierrez is a 63 year old man with a history of tobacco abuse, COPD, degenerative joint disease, chronic back pain, chronic narcotic dependence, coronary and aortic atherosclerosis, and a left lower lobe lung nodule.   He had a low-dose CT for lung cancer screening which found a left lower lobe lung nodule.  He then had a PET/CT in January 2025 which showed a 2 cm subsolid nodule in the left lower lobe that was hypermetabolic with an SUV of 5.  There was no mediastinal or hilar adenopathy.  He was evaluated by Dr.  Luna Salinas at which time he admitted to continued tobacco abuse.  He is trying to quit and was down to 0.5 ppd.  The patient is short of breath with exertion and at rest.  This relieves with use of bronchodilators.  He has chronic pain which renders him disabled.  He was informed that he very likely has a primary new lung cancer.  It was recommended the patient could undergo surgical resection vs. Radiation therapy.  The patient had family members with bad radiation experience and he did not wish to be evaluated by radiation oncology.  He wished to undergo surgical resection.  He was counseled that he has enough lung capacity to tolerate a lobectomy, however he will have a significant reduction in capacity post surgery.  The risks and benefits of the procedure were explained to the patient and he was agreeable to proceed.  Hospital Course:  [name] presented to [facility] on 06/07/2023.  He was taken to the operating room and underwent Robotic Assisted Left Video Thoracoscopy with Left Lower Lobectomy, Lymph node dissection, and intercostal nerve block.  He tolerated the procedure without difficulty, was extubated, and taken to the PACU in stable condition. He had a soft blood pressure and was given albumin . He had moderate chest tube drainage and an air leak on exam, chest tube was placed to suction 24 hours beginning on postop day 1. CXR showed  subcutaneous emphysema of the left side and a left apical space.  And this all remained stable.  The chest tube was placed back to waterseal on postop day 2 with no change in the subcu air or small pneumothorax.  The airleak continued but appeared to be slowing by postop day 3.  Chest tube drainage also diminished to 100 mL per 12 hours by postop day 3.  He had expected acute postoperative blood loss anemia that was closely monitored and remained stable.

## 2023-06-07 NOTE — Anesthesia Preprocedure Evaluation (Signed)
 Anesthesia Evaluation  Patient identified by MRN, date of birth, ID band Patient awake    Reviewed: Allergy & Precautions, NPO status , Patient's Chart, lab work & pertinent test results  Airway Mallampati: III   Neck ROM: Limited    Dental  (+) Poor Dentition   Pulmonary COPD,  COPD inhaler, Current Smoker and Patient abstained from smoking. LLL nodule    Pulmonary exam normal        Cardiovascular hypertension,  Rhythm:Regular Rate:Normal     Neuro/Psych negative neurological ROS  negative psych ROS   GI/Hepatic negative GI ROS, Neg liver ROS,,,  Endo/Other  negative endocrine ROS    Renal/GU negative Renal ROS  negative genitourinary   Musculoskeletal  (+) Arthritis , Osteoarthritis,    Abdominal Normal abdominal exam  (+)   Peds  Hematology Lab Results      Component                Value               Date                      WBC                      4.7                 06/05/2023                HGB                      15.7                06/05/2023                HCT                      46.8                06/05/2023                MCV                      100.0               06/05/2023                PLT                      259                 06/05/2023             Lab Results      Component                Value               Date                      NA                       140                 06/05/2023                K  5.0                 06/05/2023                CO2                      30                  06/05/2023                GLUCOSE                  91                  06/05/2023                BUN                      17                  06/05/2023                CREATININE               0.96                06/05/2023                CALCIUM                  9.8                 06/05/2023                GFRNONAA                 >60                 06/05/2023               Anesthesia Other Findings   Reproductive/Obstetrics                             Anesthesia Physical Anesthesia Plan  ASA: 3  Anesthesia Plan: General   Post-op Pain Management:    Induction: Intravenous  PONV Risk Score and Plan: 1 and Ondansetron , Dexamethasone , Midazolam  and Treatment may vary due to age or medical condition  Airway Management Planned: Mask and Double Lumen EBT  Additional Equipment: Arterial line  Intra-op Plan:   Post-operative Plan: Extubation in OR  Informed Consent: I have reviewed the patients History and Physical, chart, labs and discussed the procedure including the risks, benefits and alternatives for the proposed anesthesia with the patient or authorized representative who has indicated his/her understanding and acceptance.     Dental advisory given  Plan Discussed with: CRNA  Anesthesia Plan Comments:        Anesthesia Quick Evaluation

## 2023-06-07 NOTE — Brief Op Note (Addendum)
 06/07/2023  2:41 PM  PATIENT:  Miguel Gutierrez  63 y.o. male  PRE-OPERATIVE DIAGNOSIS:  Left Lower Lobe Nodule  POST-OPERATIVE DIAGNOSIS:  Squamous cell carcinoma left lower lobe- Clinical stage IA (T1,N0)  PROCEDURE:  Procedure(s) with comments:  ROBOTIC ASSISTED LEFT VIDEO THORACOSCOPY -LEFT LOWER LOBECTOMY BLOCK, NERVE, INTERCOSTAL (Left) LYMPH NODE DISSECTION(Left)  SURGEON:  Surgeons and Role:    * Zelphia Higashi, MD - Primary  PHYSICIAN ASSISTANT: Erin Barrett PA-C, Margo Shells PA- Student  ASSISTANTS: none   ANESTHESIA:   general  EBL:  Per Anesthesia Record  BLOOD ADMINISTERED:none  DRAINS: 28 Straight Chest TUbe   LOCAL MEDICATIONS USED:  BUPIVICAINE   SPECIMEN:  Source of Specimen:  Left Lower Lobe, Lymph Nodes  DISPOSITION OF SPECIMEN:  PATHOLOGY  COUNTS:  YES  TOURNIQUET:  * No tourniquets in log *  DICTATION: .Dragon Dictation  PLAN OF CARE: Admit to inpatient   PATIENT DISPOSITION:  PACU - hemodynamically stable.   Delay start of Pharmacological VTE agent (>24hrs) due to surgical blood loss or risk of bleeding: no

## 2023-06-07 NOTE — Transfer of Care (Signed)
 Immediate Anesthesia Transfer of Care Note  Patient: Aum Caggiano  Procedure(s) Performed: LOBECTOMY, LUNG, ROBOT-ASSISTED, USING VATS (Left: Chest) BLOCK, NERVE, INTERCOSTAL (Left: Chest) BIOPSY, LYMPH NODE (Left: Chest)  Patient Location: PACU  Anesthesia Type:General  Level of Consciousness: awake and patient cooperative  Airway & Oxygen Therapy: Patient Spontanous Breathing and Patient connected to face mask oxygen  Post-op Assessment: Report given to RN and Post -op Vital signs reviewed and stable  Post vital signs: Reviewed and stable  Last Vitals:  Vitals Value Taken Time  BP 125/99 06/07/23 1455  Temp    Pulse 82 06/07/23 1500  Resp 14 06/07/23 1459  SpO2 94 % 06/07/23 1500  Vitals shown include unfiled device data.  Last Pain:  Vitals:   06/07/23 1023  TempSrc: Oral  PainSc: 6          Complications: There were no known notable events for this encounter.

## 2023-06-07 NOTE — Interval H&P Note (Signed)
 History and Physical Interval Note:  Repeat CT showed slight increase in size of nodule, no new issues  06/07/2023 10:52 AM  Miguel Gutierrez  has presented today for surgery, with the diagnosis of LLL nodule.  The various methods of treatment have been discussed with the patient and family. After consideration of risks, benefits and other options for treatment, the patient has consented to  Procedure(s) with comments: LOBECTOMY, LUNG, ROBOT-ASSISTED, USING VATS (Left) - Robotic Left Lower Lobectomy as a surgical intervention.  The patient's history has been reviewed, patient examined, no change in status, stable for surgery.  I have reviewed the patient's chart and labs.  Questions were answered to the patient's satisfaction.     Miguel Gutierrez

## 2023-06-08 ENCOUNTER — Encounter (HOSPITAL_COMMUNITY): Payer: Self-pay | Admitting: Thoracic Surgery (Cardiothoracic Vascular Surgery)

## 2023-06-08 ENCOUNTER — Inpatient Hospital Stay (HOSPITAL_COMMUNITY)

## 2023-06-08 LAB — CBC
HCT: 27.9 % — ABNORMAL LOW (ref 39.0–52.0)
Hemoglobin: 9.3 g/dL — ABNORMAL LOW (ref 13.0–17.0)
MCH: 33.6 pg (ref 26.0–34.0)
MCHC: 33.3 g/dL (ref 30.0–36.0)
MCV: 100.7 fL — ABNORMAL HIGH (ref 80.0–100.0)
Platelets: 174 10*3/uL (ref 150–400)
RBC: 2.77 MIL/uL — ABNORMAL LOW (ref 4.22–5.81)
RDW: 12.5 % (ref 11.5–15.5)
WBC: 10.3 10*3/uL (ref 4.0–10.5)
nRBC: 0 % (ref 0.0–0.2)

## 2023-06-08 LAB — BASIC METABOLIC PANEL WITH GFR
Anion gap: 5 (ref 5–15)
BUN: 17 mg/dL (ref 8–23)
CO2: 24 mmol/L (ref 22–32)
Calcium: 8.1 mg/dL — ABNORMAL LOW (ref 8.9–10.3)
Chloride: 107 mmol/L (ref 98–111)
Creatinine, Ser: 0.98 mg/dL (ref 0.61–1.24)
GFR, Estimated: >60 mL/min (ref >60)
Glucose, Bld: 115 mg/dL — ABNORMAL HIGH (ref 70–99)
Potassium: 4.6 mmol/L (ref 3.5–5.1)
Sodium: 136 mmol/L (ref 135–145)

## 2023-06-08 MED ORDER — ALBUMIN HUMAN 5 % IV SOLN
12.5000 g | Freq: Once | INTRAVENOUS | Status: AC
  Administered 2023-06-08: 12.5 g via INTRAVENOUS
  Filled 2023-06-08: qty 250

## 2023-06-08 MED ORDER — CHLORHEXIDINE GLUCONATE CLOTH 2 % EX PADS
6.0000 | MEDICATED_PAD | Freq: Once | CUTANEOUS | Status: AC
  Administered 2023-06-08: 6 via TOPICAL

## 2023-06-08 NOTE — TOC CM/SW Note (Addendum)
 Transition of Care [facility]) - Inpatient Brief Assessment   Patient Details  Name: Miguel Gutierrez MRN: [id number] Date of Birth: 02-Apr-1960  Transition of Care [facility]) CM/SW Contact:    Juliane Och, LCSW Phone Number: 06/08/2023, 9:17 AM   Clinical Narrative:  9:17 AM Per chart review, patient resides at home alone. Patient has insurance but not a PCP. Patient does not have SNF/HH history. Patient has DME history (rolling walker). TOC will continue to follow.  Transition of Care Asessment: Insurance and Status: Insurance coverage has been reviewed Patient has primary care physician: No Home environment has been reviewed: Private Residence Prior level of function:: N/A Prior/Current Home Services: No current home services Social Drivers of Health Review: SDOH reviewed no interventions necessary Readmission risk has been reviewed: Yes Transition of care needs: transition of care needs identified, TOC will continue to follow

## 2023-06-08 NOTE — Anesthesia Postprocedure Evaluation (Signed)
 Anesthesia Post Note  Patient: Miguel Gutierrez  Procedure(s) Performed: LOBECTOMY, LUNG, ROBOT-ASSISTED, USING VATS (Left: Chest) BLOCK, NERVE, INTERCOSTAL (Left: Chest) BIOPSY, LYMPH NODE (Left: Chest)     Patient location during evaluation: PACU Anesthesia Type: General Level of consciousness: awake and alert Pain management: pain level controlled Vital Signs Assessment: post-procedure vital signs reviewed and stable Respiratory status: spontaneous breathing, nonlabored ventilation, respiratory function stable and patient connected to nasal cannula oxygen Cardiovascular status: blood pressure returned to baseline and stable Postop Assessment: no apparent nausea or vomiting Anesthetic complications: no   There were no known notable events for this encounter.               Corneluis Allston D Mosella Kasa

## 2023-06-08 NOTE — Op Note (Signed)
 NAMEJIBRIL, Miguel Gutierrez MEDICAL RECORD NO: [id number] ACCOUNT NO: [id number] DATE OF BIRTH: 12-11-1960 FACILITY: [facility] LOCATION: [facility] PHYSICIAN: Milon Aloe. Luna Salinas, MD  Operative Report   DATE OF PROCEDURE: 06/07/2023  PREOPERATIVE DIAGNOSIS: Left lower lobe lung nodule.  POSTOPERATIVE DIAGNOSIS: Squamous cell carcinoma, left lower lobe, clinical stage IA (T1, N0).  PROCEDURE:  Robotic-assisted left lower lobectomy,  Lymph node dissection,  Intercostal nerve blocks levels 3 through 10.  SURGEON: Milon Aloe. Luna Salinas, MD.  ASSISTANT: Gates Kasal, PA  Second Assistant: Margo Shells, PA student.  ANESTHESIA: General.  FINDINGS: Spiculated mass in right lower lobe. Frozen section confirmed squamous cell carcinoma, bronchial margin free of tumor, enlarged but otherwise benign-appearing lymph nodes.  CLINICAL NOTE: Miguel Gutierrez is a 63 year old man with a history of tobacco abuse and COPD who was found to have a left lower lobe lung nodule on a low-dose CT for lung cancer screening. PET showed the nodule was hypermetabolic with an SUV of 5. Findings were characteristic of a stage IA non-small cell carcinoma. He was offered the option of surgical resection versus stereotactic radiation. He did have adequate pulmonary reserve to tolerate a lobectomy despite significant emphysema. He understood that there would be a significant effect on his respiratory capacity with resection. He accepted the risks and future limitations and agreed to proceed.  OPERATIVE NOTE: Miguel Gutierrez was brought to the preoperative holding area on 06/07/2023. Anesthesia established central venous access and placed an arterial blood pressure monitor line. The patient was taken to the operating room and anesthetized and intubated with a double-lumen endotracheal tube. Intravenous antibiotics were administered. A Foley catheter was placed. Sequential compression devices were placed on the calves for DVT prophylaxis. The patient  was placed in a right lateral decubitus position. A Bair Hugger was placed for active warming. The left chest was prepped and draped in the usual sterile fashion. A single lung ventilation of the right lung was initiated and was tolerated well throughout the procedure.  A time-out was performed. A solution containing 20 mL of liposomal bupivacaine , 30 mL of 0.5% bupivacaine , and 50 mL of saline was prepared. This solution was used for local at the incision sites as well as for the intercostal nerve blocks. An incision was made in the eighth interspace in the midaxillary line. An 8-mm robotic port was inserted. The thoracoscope was advanced into the chest. After confirming intrapleural placement, carbon dioxide was insufflated per protocol. A 12-mm robotic port was placed anterior to the camera port. Intercostal nerve blocks then were performed from the 3rd to the 10th interspace by injecting 10 mL of the bupivacaine  solution into a subpleural plane at each level. A 12-mm AirSeal port was placed in the 10th interspace posterolaterally and two additional 8th interspace robotic ports were placed. The robot was deployed. The camera arm was docked. Targeting was performed. The remaining arms were docked. The robotic instruments were inserted with thoracoscopic visualization.  The nodule was immediately visible with invagination of the visceral pleura. Gross appearance and radiographic appearance were both characteristic of non-small cell carcinoma. Dissection was performed using bipolar cautery. The lung was retracted superiorly. The inferior ligament was divided. A level 9 node was removed. The lung then was retracted anteriorly and the pleural reflection was divided at the hilum posteriorly and level 7 and 8 nodes were removed. The initial dissection of the pleura over the pulmonary artery was begun posteriorly. Working further superiorly, level 4R and 10 nodes were removed and then there were some adhesions at  the  apex, which were taken down with cautery and the lung was retracted down and the AP window was explored. The relatively large but otherwise benign-appearing level 5 node was removed. Attention then was turned to the fissure. It was complete posteriorly but incomplete anteriorly. The lung was retracted posteriorly. The pleural reflection was  divided anteriorly. The inferior pulmonary vein dissection was completed and then level 11 nodes were removed. The fissure was completed anteriorly with sequential firings of the robotic stapler using blue staple cartridges. The inferior pulmonary vein was divided with the robotic stapler. Level 12 nodes were dissected off the pulmonary artery and the basilar and superior segmental pulmonary artery branches were encircled and divided with a robotic stapler. Finally, the stapler was placed across the lower lobe bronchus at its origin. A test inflation showed good aeration of the upper lobe. The stapler was fired transecting the bronchus. The vessel loop and sponges used during the dissection were removed. The chest was copiously irrigated with saline. A test inflation to 30 cm of water pressure revealed no air leakage. The robotic instruments were removed. The robot was undocked. The anterior eighth interspace incision was lengthened to approximately 3 cm. A 15-mm endoscopic retrieval bag was placed into the chest and the lower lobe was manipulated into the bag and then removed. It was sent for frozen section of the nodule and the margin. The nodule turned out to be a squamous cell carcinoma. The margin was negative. All port sites and staple lines were inspected for hemostasis. A 28-French chest tube was placed through the original port incision and directed to the apex. Dual lung ventilation was resumed. The incisions were closed in standard fashion. Dermabond was applied. The chest tube was placed to a Pleurovac on water seal. The patient was placed back in the supine  position. The patient was extubated in the operating room and taken to the post-anesthesia care unit in good condition. All sponge, needle, and instrument counts were correct at the end of the procedure.  Experienced assistance was necessary for this case due to surgical complexity. Erin Barrett assisted with port placement, robot docking and undocking, instrument exchange, specimen retrieval, suctioning, and wound closure.    SUJ D: 06/07/2023 6:27:40 pm T: 06/08/2023 12:09:00 am  JOB: [id number]/ [id number]

## 2023-06-08 NOTE — Progress Notes (Signed)
 Patient BP 75/55 map 63. Patient asymptomatic, appears to be resting comfortably, and is easily aroused to sound and touch. MD Bartle notified. New order for albumin (see Mar).

## 2023-06-08 NOTE — Progress Notes (Signed)
 Discussed ambulation with patient.  He is unsure that he can tolerate ambulation in hallway at this time due to pain and back spasms.  He states he never knows when these will hit.  PRN pain medication given. Will recheck BP within 30 minutes then provide PRN diazepam  which he states helps spasms, before attempting ambulation.  Explained importance of hallway ambulation while in hospital, patient stated understanding.

## 2023-06-08 NOTE — Progress Notes (Addendum)
 [address]       Miguel Gutierrez [address]             [phone]      1 Day Post-Op Procedure(s) (LRB): LOBECTOMY, LUNG, ROBOT-ASSISTED, USING VATS (Left) BLOCK, NERVE, INTERCOSTAL (Left) BIOPSY, LYMPH NODE (Left) Subjective: Patient states he is hurting this AM all over. He is sitting in the chair and ate his breakfast.  Objective: Vital signs in last 24 hours: Temp:  [95.7 F (35.4 C)-99.5 F (37.5 C)] 98.9 F (37.2 C) (06/06 0710) Pulse Rate:  [67-111] 83 (06/06 0400) Cardiac Rhythm: Normal sinus rhythm (06/05 1953) Resp:  [12-22] 17 (06/06 0729) BP: (73-112)/(54-95) 97/66 (06/06 0729) SpO2:  [86 %-100 %] 92 % (06/06 0710) Arterial Line BP: (94-132)/(53-66) 94/54 (06/05 1600) Weight:  [59.8 kg] 59.8 kg (06/05 2000)  Hemodynamic parameters for last 24 hours:    Intake/Output from previous day: 06/05 0701 - 06/06 0700 In: 1050 [I.V.:800; IV Piggyback:250] Out: 2285 [Urine:1650; Blood:25; Chest Tube:610] Intake/Output this shift: No intake/output data recorded.  General appearance: alert, cooperative, and no distress Neurologic: intact Heart: regular rate and rhythm-sinus tachycardia, no murmur Lungs: clear to auscultation bilaterally Abdomen: soft, non-tender; bowel sounds normal; no masses,  no organomegaly Extremities: extremities normal, atraumatic, no cyanosis or edema Wound: Clean and dry without sign of infection, chest tube with bloody drainage  Lab Results: Recent Labs    06/05/23 1345 06/07/23 1248 06/08/23 0236  WBC 4.7  --  10.3  HGB 15.7 12.6* 9.3*  HCT 46.8 37.0* 27.9*  PLT 259  --  174   BMET:  Recent Labs    06/05/23 1345 06/07/23 1248 06/08/23 0236  NA 140 141 136  K 5.0 4.0 4.6  CL 101  --  107  CO2 30  --  24  GLUCOSE 91  --  115*  BUN 17  --  17  CREATININE 0.96  --  0.98  CALCIUM 9.8  --  8.1*    PT/INR:  Recent Labs    06/05/23 1345  LABPROT 13.2  INR 1.0   ABG    Component Value Date/Time    PHART 7.344 (L) 06/07/2023 1248   HCO3 25.4 06/07/2023 1248   TCO2 27 06/07/2023 1248   ACIDBASEDEF 1.0 06/07/2023 1248   O2SAT 100 06/07/2023 1248   CBG (last 3)  No results for input(s): "GLUCAP" in the last 72 hours.  Assessment/Plan: S/P Procedure(s) (LRB): LOBECTOMY, LUNG, ROBOT-ASSISTED, USING VATS (Left) BLOCK, NERVE, INTERCOSTAL (Left) BIOPSY, LYMPH NODE (Left)  Neuro: Patient is having pain this AM. Received Oxycodone  and Fentanyl  last night around 9pm but blood pressures have been soft. Received scheduled Tylenol  and Toradol  this AM. Continue Gabapentin and Baclofen. Use Oxycodone  and Fentanyl  as needed. Hopefully BP will remain stable.   CV: Hypotension, SBP 70-80s last night, now low 90s-100s after he was given albumin around 0200. NSR-ST. HR 100 this AM.   Pulm: CT output 610cc/24hrs, 570cc last shift bloody drainage. CT with +air leak will transition to suction. CXR with left apical space, no pleural effusion, left sided subcutaneous emphysema. Encourage IS and ambulation.   GI: Tolerating a diet, no N/V  Renal: Cr 0.98, stable. UO 1650cc/24hrs  ID: No leukocytosis, tmax 99.5.   Expected postop ABLA: H/H decreased from 12.6/37 to 9.3/27.9. Not close to transfusion threshold but with hypotension will continue to closely monitor.  DVT Prophylaxis: Lovenox  Dispo: Will transition CT to suction with +air leak this AM.  Work on pain control.    LOS: 1 day    Randa Burton, PA-C 06/08/2023 Patient seen and examined. Has had some bloody drainage overnight and Hgb 9.3- also has some subcutaneous emphysema- will place CT to suction for 24 hours Pain control an issue as expected given his history - pain meds as outlined above BP has been on low side since surgery, continue to monitor  Landon Pinion C. Luna Salinas, MD [facility] [phone]

## 2023-06-09 ENCOUNTER — Inpatient Hospital Stay (HOSPITAL_COMMUNITY)

## 2023-06-09 LAB — COMPREHENSIVE METABOLIC PANEL WITH GFR
ALT: 18 U/L (ref 0–44)
AST: 21 U/L (ref 15–41)
Albumin: 3.2 g/dL — ABNORMAL LOW (ref 3.5–5.0)
Alkaline Phosphatase: 39 U/L (ref 38–126)
Anion gap: 3 — ABNORMAL LOW (ref 5–15)
BUN: 15 mg/dL (ref 8–23)
CO2: 28 mmol/L (ref 22–32)
Calcium: 8.5 mg/dL — ABNORMAL LOW (ref 8.9–10.3)
Chloride: 105 mmol/L (ref 98–111)
Creatinine, Ser: 1.12 mg/dL (ref 0.61–1.24)
GFR, Estimated: >60 mL/min (ref >60)
Glucose, Bld: 105 mg/dL — ABNORMAL HIGH (ref 70–99)
Potassium: 4.5 mmol/L (ref 3.5–5.1)
Sodium: 136 mmol/L (ref 135–145)
Total Bilirubin: 0.6 mg/dL (ref 0.0–1.2)
Total Protein: 4.8 g/dL — ABNORMAL LOW (ref 6.5–8.1)

## 2023-06-09 LAB — CBC
HCT: 27.8 % — ABNORMAL LOW (ref 39.0–52.0)
Hemoglobin: 9.2 g/dL — ABNORMAL LOW (ref 13.0–17.0)
MCH: 33.3 pg (ref 26.0–34.0)
MCHC: 33.1 g/dL (ref 30.0–36.0)
MCV: 100.7 fL — ABNORMAL HIGH (ref 80.0–100.0)
Platelets: 166 10*3/uL (ref 150–400)
RBC: 2.76 MIL/uL — ABNORMAL LOW (ref 4.22–5.81)
RDW: 12.6 % (ref 11.5–15.5)
WBC: 6.5 10*3/uL (ref 4.0–10.5)
nRBC: 0 % (ref 0.0–0.2)

## 2023-06-09 MED ORDER — LACTULOSE 10 GM/15ML PO SOLN
20.0000 g | Freq: Once | ORAL | Status: AC
  Administered 2023-06-09: 20 g via ORAL
  Filled 2023-06-09: qty 30

## 2023-06-09 NOTE — Plan of Care (Signed)
  Problem: Education: Goal: Knowledge of General Education information will improve Description: Including pain rating scale, medication(s)/side effects and non-pharmacologic comfort measures Outcome: Progressing   Problem: Clinical Measurements: Goal: Ability to maintain clinical measurements within normal limits will improve Outcome: Progressing Goal: Will remain free from infection Outcome: Progressing Goal: Diagnostic test results will improve Outcome: Progressing Goal: Respiratory complications will improve Outcome: Progressing Goal: Cardiovascular complication will be avoided Outcome: Progressing   Problem: Activity: Goal: Risk for activity intolerance will decrease Outcome: Progressing   Problem: Nutrition: Goal: Adequate nutrition will be maintained Outcome: Progressing   Problem: Coping: Goal: Level of anxiety will decrease Outcome: Progressing   Problem: Elimination: Goal: Will not experience complications related to bowel motility Outcome: Progressing Goal: Will not experience complications related to urinary retention Outcome: Progressing   Problem: Pain Managment: Goal: General experience of comfort will improve and/or be controlled Outcome: Progressing   Problem: Safety: Goal: Ability to remain free from injury will improve Outcome: Progressing   Problem: Skin Integrity: Goal: Risk for impaired skin integrity will decrease Outcome: Progressing   Problem: Activity: Goal: Risk for activity intolerance will decrease Outcome: Progressing   Problem: Cardiac: Goal: Will achieve and/or maintain hemodynamic stability Outcome: Progressing

## 2023-06-09 NOTE — Progress Notes (Signed)
 [address]       [place] [address]             [phone]      2 Days Post-Op Procedure(s) (LRB): LOBECTOMY, LUNG, ROBOT-ASSISTED, USING VATS (Left) BLOCK, NERVE, INTERCOSTAL (Left) BIOPSY, LYMPH NODE (Left) Subjective:  Awake and alert, up walking around in his room today.   Objective: Vital signs in last 24 hours: Temp:  [98 F (36.7 C)-98.7 F (37.1 C)] 98.1 F (36.7 C) (06/07 0704) Pulse Rate:  [77-104] 77 (06/07 0419) Cardiac Rhythm: Normal sinus rhythm (06/07 0700) Resp:  [13-24] 14 (06/07 0419) BP: (89-111)/(56-73) 91/57 (06/07 1042) SpO2:  [88 %-96 %] 96 % (06/07 0419)     Intake/Output from previous day: 06/06 0701 - 06/07 0700 In: 529.2 [I.V.:529.2] Out: 1700 [Urine:950; Chest Tube:750] Intake/Output this shift: Total I/O In: 240 [P.O.:240] Out: -   General appearance: alert, cooperative, and no distress Neurologic: intact Heart: regular rate and rhythm Lungs: clear to auscultation bilaterally.  He continues to have mild left anterior chest wall subcu air.  There is a small air leak in the Pleur-evac and moderate drainage persists.  Chest x-ray shows an decrease in the size of the left apical pneumothorax Abdomen: soft, non-tender Extremities:  no edema Wound: Clean and dry  Lab Results: Recent Labs    06/08/23 0236 06/09/23 0210  WBC 10.3 6.5  HGB 9.3* 9.2*  HCT 27.9* 27.8*  PLT 174 166   BMET:  Recent Labs    06/08/23 0236 06/09/23 0210  NA 136 136  K 4.6 4.5  CL 107 105  CO2 24 28  GLUCOSE 115* 105*  BUN 17 15  CREATININE 0.98 1.12  CALCIUM 8.1* 8.5*    PT/INR:  No results for input(s): "LABPROT", "INR" in the last 72 hours.  ABG    Component Value Date/Time   PHART 7.344 (L) 06/07/2023 1248   HCO3 25.4 06/07/2023 1248   TCO2 27 06/07/2023 1248   ACIDBASEDEF 1.0 06/07/2023 1248   O2SAT 100 06/07/2023 1248   CBG (last 3)  No results for input(s): "GLUCAP" in the last 72 hours.  CLINICAL DATA:   Status post lobectomy.   EXAM: PORTABLE CHEST 1 VIEW   COMPARISON:  Chest radiograph dated 06/08/2023.   FINDINGS: Left-sided chest tube in similar position. Interval decrease in the size of left apical pneumothorax. Eighty minimal residual pneumothorax may be present but difficult to see due to extensive chest wall emphysema. There is no focal consolidation or pleural effusion. Background of emphysema. Stable cardiac silhouette. No acute osseous pathology.   IMPRESSION: Interval decrease in the size of left apical pneumothorax.     Electronically Signed   By: Angus Bark M.D.   On: 06/09/2023 08:42  Assessment/Plan: S/P Procedure(s) (LRB): LOBECTOMY, LUNG, ROBOT-ASSISTED, USING VATS (Left) BLOCK, NERVE, INTERCOSTAL (Left) BIOPSY, LYMPH NODE (Left)  Neuro: Has chronic pain managed with narcotics in the outpatient setting.  Pain control seems to be a little better today.  Continue Gabapentin  and Baclofen . Use Oxycodone  and Fentanyl  as needed.   CV: Blood pressure stable past 24 hours and mid 90s systolic. NSR-ST. HR 100 this AM.   Pulm: CT output 750cc/24hrs. CT with +air leak.  CXR showing improvement in the left pneumothorax and left sided subcutaneous emphysema.  Will transition the drain back to waterseal today.  Encourage IS and ambulation.   GI: Tolerating a diet, no N/V.  Feels he needs to have a bowel  movement but unable.  Lactulose today.  Renal: Cr 1.1, stable.   Expected postop ABLA: Hematocrit stable at around 28.  Tolerating well.  Monitor.  DVT Prophylaxis: Lovenox   Dispo: Will transition CT to back to waterseal.    LOS: 2 days    Emiyah Spraggins G. Blakely Maranan, PA-C 06/09/2023  Agree IS/ambulation  Harrell O Lightfoot

## 2023-06-10 ENCOUNTER — Inpatient Hospital Stay (HOSPITAL_COMMUNITY)

## 2023-06-10 LAB — BASIC METABOLIC PANEL WITH GFR
Anion gap: 6 (ref 5–15)
BUN: 14 mg/dL (ref 8–23)
CO2: 27 mmol/L (ref 22–32)
Calcium: 8.5 mg/dL — ABNORMAL LOW (ref 8.9–10.3)
Chloride: 103 mmol/L (ref 98–111)
Creatinine, Ser: 0.97 mg/dL (ref 0.61–1.24)
GFR, Estimated: >60 mL/min (ref >60)
Glucose, Bld: 104 mg/dL — ABNORMAL HIGH (ref 70–99)
Potassium: 4.2 mmol/L (ref 3.5–5.1)
Sodium: 136 mmol/L (ref 135–145)

## 2023-06-10 NOTE — Progress Notes (Addendum)
 [address]       [place] [address]             [phone]      3 Days Post-Op Procedure(s) (LRB): LOBECTOMY, LUNG, ROBOT-ASSISTED, USING VATS (Left) BLOCK, NERVE, INTERCOSTAL (Left) BIOPSY, LYMPH NODE (Left) Subjective:  Awake and alert, no new concerns. Currently on room air Bowel movement yesterday   Objective: Vital signs in last 24 hours: Temp:  [97.8 F (36.6 C)-98.3 F (36.8 C)] 98.1 F (36.7 C) (06/08 0700) Pulse Rate:  [90-98] 92 (06/08 0700) Cardiac Rhythm: Sinus tachycardia (06/08 0700) Resp:  [14-18] 17 (06/08 0700) BP: (91-115)/(57-76) 95/72 (06/08 0700) SpO2:  [90 %-92 %] 91 % (06/08 0700)     Intake/Output from previous day: 06/07 0701 - 06/08 0700 In: 1440 [P.O.:1440] Out: 2190 [Urine:1950; Chest Tube:240] Intake/Output this shift: Total I/O In: 120 [P.O.:120] Out: -   General appearance: alert, cooperative, and no distress Neurologic: intact Heart: regular rate and rhythm Lungs: clear to auscultation bilaterally.  He continues to have mild left anterior chest wall subcu air unchanged from yesterday.  There is a small air leak in the Pleur-evac.  Drainage has slowed to 100 mL for past 12 hours.  Chest x-ray shows stable left apical pneumothorax and stable subcu air Abdomen: soft, non-tender Extremities:  no edema Wound: Clean and dry  Lab Results: Recent Labs    06/08/23 0236 06/09/23 0210  WBC 10.3 6.5  HGB 9.3* 9.2*  HCT 27.9* 27.8*  PLT 174 166   BMET:  Recent Labs    06/09/23 0210 06/10/23 0215  NA 136 136  K 4.5 4.2  CL 105 103  CO2 28 27  GLUCOSE 105* 104*  BUN 15 14  CREATININE 1.12 0.97  CALCIUM 8.5* 8.5*    PT/INR:  No results for input(s): "LABPROT", "INR" in the last 72 hours.  ABG    Component Value Date/Time   PHART 7.344 (L) 06/07/2023 1248   HCO3 25.4 06/07/2023 1248   TCO2 27 06/07/2023 1248   ACIDBASEDEF 1.0 06/07/2023 1248   O2SAT 100 06/07/2023 1248   CBG (last 3)  No  results for input(s): "GLUCAP" in the last 72 hours.  CCLINICAL DATA:  Pneumothorax.   EXAM: PORTABLE CHEST 1 VIEW   COMPARISON:  Chest radiograph dated 06/09/2023.   FINDINGS: A small left apical pneumothorax similar to prior radiographs. Evaluation is limited due to overlying chest wall emphysema. Stable positioning of chest tube. Background of emphysema. Stable cardiac silhouette. No acute osseous pathology.   IMPRESSION: Small left apical pneumothorax similar to prior radiographs.     Electronically Signed   By: Angus Bark M.D.   On: 06/10/2023 08:37   Assessment/Plan: S/P Procedure(s) (LRB): LOBECTOMY, LUNG, ROBOT-ASSISTED, USING VATS (Left) BLOCK, NERVE, INTERCOSTAL (Left) BIOPSY, LYMPH NODE (Left)  Neuro: Has chronic pain managed with narcotics in the outpatient setting.  Pain control reasonable on his current regimen.  Continue Gabapentin  and Baclofen . Use Oxycodone  and Fentanyl  as needed.   CV: Blood pressure stable past 24 hours and mid 90s systolic. NSR-ST. HR 100 this AM.   Pulm: CT output 100 mL/12 hrs. CT with moderate air leak unchanged from yesterday.  CXR stable.  Continue chest tube to waterseal.  Encourage IS and ambulation.   GI: Tolerating a diet, no N/V.  Bowel movement after lactulose yesterday.  Renal: Cr 0.97, stable.   Expected postop ABLA: Hematocrit stable at around 28.  Tolerating well.  Monitor.  DVT Prophylaxis: Lovenox   Dispo: Leave chest tube on waterseal while awaiting airleak and subcu air to resolve.   LOS: 3 days    Myron G. Roddenberry, PA-C 06/10/2023    Agree Continue chest tube to waterseal IS/ambulation  Haydee Jabbour O Brittain Hosie

## 2023-06-11 ENCOUNTER — Inpatient Hospital Stay (HOSPITAL_COMMUNITY)

## 2023-06-11 LAB — SURGICAL PATHOLOGY

## 2023-06-11 MED ORDER — GUAIFENESIN ER 600 MG PO TB12
600.0000 mg | ORAL_TABLET | Freq: Two times a day (BID) | ORAL | Status: DC
  Administered 2023-06-11 – 2023-06-14 (×7): 600 mg via ORAL
  Filled 2023-06-11 (×7): qty 1

## 2023-06-11 NOTE — Progress Notes (Addendum)
      [address]       [place] [address]             [phone]      4 Days Post-Op Procedure(s) (LRB): LOBECTOMY, LUNG, ROBOT-ASSISTED, USING VATS (Left) BLOCK, NERVE, INTERCOSTAL (Left) BIOPSY, LYMPH NODE (Left) Subjective: Patient states he has more pain this AM after his dressing was changed. He also admits to some coughing fits with blood tinged sputum.  Objective: Vital signs in last 24 hours: Temp:  [97.6 F (36.4 C)-99.6 F (37.6 C)] 98.1 F (36.7 C) (06/09 0739) Pulse Rate:  [80-99] 89 (06/09 0739) Cardiac Rhythm: Normal sinus rhythm (06/08 2146) Resp:  [16-18] 16 (06/09 0739) BP: (95-114)/(60-79) 101/79 (06/09 0404) SpO2:  [89 %-95 %] 94 % (06/09 0739)  Hemodynamic parameters for last 24 hours:    Intake/Output from previous day: 06/08 0701 - 06/09 0700 In: 1080 [P.O.:1080] Out: 2800 [Urine:2800] Intake/Output this shift: No intake/output data recorded.  General appearance: alert, cooperative, and no distress Neurologic: intact Heart: regular rate and rhythm, S1, S2 normal, no murmur, click, rub or gallop Lungs: clear to auscultation bilaterally and subcutaneous emphysema of the left chest Abdomen: soft, non-tender; bowel sounds normal; no masses,  no organomegaly Extremities: extremities normal, atraumatic, no cyanosis or edema Wound: Clean and dry without sign of infection  Lab Results: Recent Labs    06/09/23 0210  WBC 6.5  HGB 9.2*  HCT 27.8*  PLT 166   BMET:  Recent Labs    06/09/23 0210 06/10/23 0215  NA 136 136  K 4.5 4.2  CL 105 103  CO2 28 27  GLUCOSE 105* 104*  BUN 15 14  CREATININE 1.12 0.97  CALCIUM 8.5* 8.5*    PT/INR: No results for input(s): "LABPROT", "INR" in the last 72 hours. ABG    Component Value Date/Time   PHART 7.344 (L) 06/07/2023 1248   HCO3 25.4 06/07/2023 1248   TCO2 27 06/07/2023 1248   ACIDBASEDEF 1.0 06/07/2023 1248   O2SAT 100 06/07/2023 1248   CBG (last 3)  No results for  input(s): "GLUCAP" in the last 72 hours.  Assessment/Plan: S/P Procedure(s) (LRB): LOBECTOMY, LUNG, ROBOT-ASSISTED, USING VATS (Left) BLOCK, NERVE, INTERCOSTAL (Left) BIOPSY, LYMPH NODE (Left)  Neuro: Patient is having pain this AM, hx of chronic pain managed with narcotics at home. Continue Tylenol , Gabapentin , Baclofen , Oxycodone  PRN and Fentanyl  PRN.   CV: Hypotension, SBP 97-114, stable. NSR, HR 70s this AM.   Pulm: Saturating well on RA. CT output 0cc/24hrs, recorded. CT with +air leak on water seal. CXR with improved left apical space, left sided subcutaneous emphysema. Encourage IS and ambulation. Will leave CT to water seal. Will add mucinex for cough.    GI: Tolerating a diet, no N/V. +BM   Renal: Yesterday's Cr 0.97, stable. UO 2800cc/24hrs   ID: No leukocytosis, tmax 99.6.    Expected postop ABLA: H/H stable 9.2/27.8. Not clinically significant at this time.    DVT Prophylaxis: Lovenox    Dispo: Will leave CT to water seal    LOS: 4 days    Randa Burton, PA-C 06/11/2023  Patient seen and examined, agree with above Small, intermittent air leak  Landon Pinion C. Luna Salinas, MD [facility] [phone]

## 2023-06-11 NOTE — Care Management Important Message (Signed)
 Important Message  Patient Details  Name: Miguel Gutierrez MRN: [id number] Date of Birth: [birth year]   Important Message Given:  Yes - Medicare IM     Wynonia Hedges 06/11/2023, 1:56 PM

## 2023-06-11 NOTE — Progress Notes (Signed)
 Mobility Specialist Progress Note;   06/11/23 0920  Mobility  Activity Ambulated with assistance in hallway  Level of Assistance Standby assist, set-up cues, supervision of patient - no hands on  Assistive Device Front wheel walker  Distance Ambulated (ft) 80 ft  Activity Response Tolerated well  Mobility Referral Yes  Mobility visit 1 Mobility  Mobility Specialist Start Time (ACUTE ONLY) 0920  Mobility Specialist Stop Time (ACUTE ONLY) 0955  Mobility Specialist Time Calculation (min) (ACUTE ONLY) 35 min   Pt agreeable to mobility. Required no physical assistance during ambulation, SV. Pt adamant on not using supplemental O2 while ambulating; SPO2 able to maintain 88%>. Displayed a slow shuffled gait. C/o soreness after dressing change and his BUE feeling fatigued after ambulating. Pt returned safely back to bed with all needs met. RN in room.   Janit Meline Mobility Specialist Please contact via SecureChat or [employer] [phone]

## 2023-06-12 ENCOUNTER — Inpatient Hospital Stay (HOSPITAL_COMMUNITY)

## 2023-06-12 LAB — MAGNESIUM: Magnesium: 2 mg/dL (ref 1.7–2.4)

## 2023-06-12 MED ORDER — AMIODARONE HCL IN DEXTROSE 360-4.14 MG/200ML-% IV SOLN
30.0000 mg/h | INTRAVENOUS | Status: AC
  Administered 2023-06-12 – 2023-06-13 (×3): 30 mg/h via INTRAVENOUS
  Filled 2023-06-12 (×4): qty 200

## 2023-06-12 MED ORDER — AMIODARONE HCL IN DEXTROSE 360-4.14 MG/200ML-% IV SOLN
INTRAVENOUS | Status: AC
  Administered 2023-06-12: 60 mg/h via INTRAVENOUS
  Filled 2023-06-12: qty 200

## 2023-06-12 MED ORDER — LACTULOSE 10 GM/15ML PO SOLN
20.0000 g | Freq: Once | ORAL | Status: AC
  Administered 2023-06-12: 20 g via ORAL
  Filled 2023-06-12: qty 30

## 2023-06-12 MED ORDER — AMIODARONE LOAD VIA INFUSION
150.0000 mg | Freq: Once | INTRAVENOUS | Status: AC
  Administered 2023-06-12: 150 mg via INTRAVENOUS

## 2023-06-12 MED ORDER — AMIODARONE HCL IN DEXTROSE 360-4.14 MG/200ML-% IV SOLN
60.0000 mg/h | INTRAVENOUS | Status: AC
  Administered 2023-06-12: 60 mg/h via INTRAVENOUS

## 2023-06-12 NOTE — Plan of Care (Signed)
  Problem: Education: Goal: Knowledge of General Education information will improve Description: Including pain rating scale, medication(s)/side effects and non-pharmacologic comfort measures Outcome: Progressing   Problem: Activity: Goal: Risk for activity intolerance will decrease Outcome: Progressing   Problem: Activity: Goal: Risk for activity intolerance will decrease Outcome: Progressing

## 2023-06-12 NOTE — Plan of Care (Signed)

## 2023-06-12 NOTE — Evaluation (Addendum)
 Physical Therapy Evaluation Patient Details Name: Miguel Gutierrez MRN: [id number] DOB: [birth year] Today's Date: 06/12/2023  History of Present Illness  63 year old man presented 06/07/23 with diagnosis of LLL nodule for lobectomy. PMH tobacco abuse, COPD, degenerative joint disease, chronic back pain, chronic narcotic dependence, coronary and aortic atherosclerosis,  Clinical Impression  Pt admitted secondary to problem above with deficits below. PTA patient lived alone in a one level home with 8 steps to enter. He alternated using a cane vs RW vs wheelchair to mobilize inside home depending on how he felt each day (strength and pain-wise). Pt currently requires CGA-min assist for mobility and only able to ambulate 15 ft x2 with RW due to left thorax pain. Patient reports he walked much better yesterday and was able to go to nurses station. He is unsure how much support his neighbor can provide upon discharge. Called neighbor and they were at work and asked to meet in pt's room tomorrow 6/11 at 3:00 pm.  Anticipate patient will benefit from PT to address problems listed below. Will continue to follow acutely to maximize functional mobility, independence, and safety.  Patient hopes to discharge home if neighbor can provide support, however currently would recommend inpatient therapies <3 hrs/day. Will follow-up 6/11 at 3:00 pm.          If plan is discharge home, recommend the following: A little help with walking and/or transfers;A little help with bathing/dressing/bathroom;Assistance with cooking/housework;Assist for transportation;Help with stairs or ramp for entrance   Can travel by private vehicle   No    Equipment Recommendations None recommended by PT  Recommendations for Other Services  OT consult    Functional Status Assessment Patient has had a recent decline in their functional status and demonstrates the ability to make significant improvements in function in a reasonable and  predictable amount of time.     Precautions / Restrictions Precautions Precautions: None      Mobility  Bed Mobility Overal bed mobility: Needs Assistance Bed Mobility: Supine to Sit, Sit to Supine     Supine to sit: HOB elevated, Used rails, Contact guard Sit to supine: Min assist   General bed mobility comments: pt wanting to do for himself yet needed HOB elevated and rail to come to sit (CGA for lines/safety) and incr time; assist to raise bil Legs onto bed    Transfers Overall transfer level: Needs assistance Equipment used: Rolling walker (2 wheels) Transfers: Sit to/from Stand Sit to Stand: Contact guard assist           General transfer comment: from EOB, toilet; pt prefers to push up on RW and "likes to do things his way"; no tipping of RW noted    Ambulation/Gait Ambulation/Gait assistance: Contact guard assist Gait Distance (Feet): 15 Feet (seated rest, 15) Assistive device: Rolling walker (2 wheels) Gait Pattern/deviations: Step-to pattern, Decreased stride length, Trunk flexed   Gait velocity interpretation: <1.31 ft/sec, indicative of household ambulator   General Gait Details: very slow gait due to reported pain at site of chest tube, low back, and neck  Stairs            Wheelchair Mobility     Tilt Bed    Modified Rankin (Stroke Patients Only)       Balance Overall balance assessment: Mild deficits observed, not formally tested  Pertinent Vitals/Pain Pain Assessment Pain Assessment: 0-10 Pain Score: 8  Pain Location: incision, low back, legs Pain Descriptors / Indicators: Discomfort, Guarding, Grimacing Pain Intervention(s): Limited activity within patient's tolerance, Monitored during session, Premedicated before session    Home Living Family/patient expects to be discharged to:: Private residence Living Arrangements: Alone Available Help at Discharge: Neighbor Type  of Home: House Home Access: Stairs to enter Entrance Stairs-Rails: Right;Left;Can reach both Entrance Stairs-Number of Steps: 8 (very wide where walker can fit on each step)   Home Layout: One level Home Equipment: Agricultural consultant (2 wheels);Wheelchair - manual;Grab bars - tub/shower;Grab bars - toilet;Shower seat;Cane - single point      Prior Function Prior Level of Function : Needs assist             Mobility Comments: alternates between cane, walker, w/c depending on how he is feeling ADLs Comments: difficulty grocery shopping and sometimes neighbor does; used to have aide (theft and released them)     Extremity/Trunk Assessment   Upper Extremity Assessment Upper Extremity Assessment: LUE deficits/detail (limited ROM due to pain L thorax/chest tube)    Lower Extremity Assessment Lower Extremity Assessment: Generalized weakness    Cervical / Trunk Assessment Cervical / Trunk Assessment: Kyphotic  Communication   Communication Communication: No apparent difficulties    Cognition Arousal: Alert Behavior During Therapy: WFL for tasks assessed/performed   PT - Cognitive impairments: No apparent impairments                         Following commands: Intact       Cueing Cueing Techniques: Verbal cues     General Comments General comments (skin integrity, edema, etc.): on RA; HR 96-104, sats 92-95%    Exercises     Assessment/Plan    PT Assessment Patient needs continued PT services  PT Problem List Decreased strength;Decreased range of motion;Decreased activity tolerance;Decreased balance;Decreased mobility;Decreased knowledge of use of DME;Pain       PT Treatment Interventions DME instruction;Gait training;Stair training;Functional mobility training;Therapeutic activities;Therapeutic exercise;Balance training;Patient/family education    PT Goals (Current goals can be found in the Care Plan section)  Acute Rehab PT Goals Patient Stated Goal:  return home if he has enough assistance PT Goal Formulation: With patient Time For Goal Achievement: 06/26/23 Potential to Achieve Goals: Good    Frequency Min 3X/week     Co-evaluation               AM-PAC PT "6 Clicks" Mobility  Outcome Measure Help needed turning from your back to your side while in a flat bed without using bedrails?: None Help needed moving from lying on your back to sitting on the side of a flat bed without using bedrails?: A Little Help needed moving to and from a bed to a chair (including a wheelchair)?: A Little Help needed standing up from a chair using your arms (e.g., wheelchair or bedside chair)?: A Little Help needed to walk in hospital room?: A Lot (<20 ft) Help needed climbing 3-5 steps with a railing? : A Lot 6 Click Score: 17    End of Session   Activity Tolerance: Patient limited by pain Patient left: in bed;with call bell/phone within reach;with bed alarm set Nurse Communication: Mobility status;Patient requests pain meds;Other (comment) (unclear discharge plan) PT Visit Diagnosis: Other abnormalities of gait and mobility (R26.89);Muscle weakness (generalized) (M62.81);Pain Pain - Right/Left: Left Pain - part of body:  (thorax)    Time: 1610-9604  PT Time Calculation (min) (ACUTE ONLY): 43 min   Charges:   PT Evaluation $PT Eval Low Complexity: 1 Low PT Treatments $Gait Training: 23-37 mins PT General Charges $$ ACUTE PT VISIT: 1 Visit          Gayle Kava, PT Acute Rehabilitation Services  Office [phone]   Guilford Leep 06/12/2023, 10:01 AM

## 2023-06-12 NOTE — Progress Notes (Signed)
 Mobility Specialist Progress Note;   06/12/23 1417  Mobility  Activity Ambulated with assistance in hallway  Level of Assistance Standby assist, set-up cues, supervision of patient - no hands on  Assistive Device Front wheel walker  Distance Ambulated (ft) 100 ft  Activity Response Tolerated well  Mobility Referral Yes  Mobility visit 1 Mobility  Mobility Specialist Start Time (ACUTE ONLY) 1417  Mobility Specialist Stop Time (ACUTE ONLY) 1440  Mobility Specialist Time Calculation (min) (ACUTE ONLY) 23 min   Pt eager for mobility. Required no physical assistance during ambulation, SV. SPO2 90%> throughout on RA. No c/o when asked. States his pain is much better. Pt returned back to bed with all needs met, call bell in reach.   Janit Meline Mobility Specialist Please contact via SecureChat or [employer] [phone]

## 2023-06-12 NOTE — Progress Notes (Addendum)
[address]       [place] [address]             [phone]      5 Days Post-Op Procedure(s) (LRB): LOBECTOMY, LUNG, ROBOT-ASSISTED, USING VATS (Left) BLOCK, NERVE, INTERCOSTAL (Left) BIOPSY, LYMPH NODE (Left) Subjective: Patient reports he still has coughing fits at times but is not coughing up as much blood tinged sputum. Does report no BM in a couple of days.  Objective: Vital signs in last 24 hours: Temp:  [98 F (36.7 C)-98.5 F (36.9 C)] 98 F (36.7 C) (06/10 0400) Pulse Rate:  [79-99] 79 (06/10 0400) Cardiac Rhythm: Atrial fibrillation (06/10 0700) Resp:  [16-18] 16 (06/09 1926) BP: (93-106)/(54-79) 97/79 (06/10 0400) SpO2:  [89 %-96 %] 91 % (06/10 0400)  Hemodynamic parameters for last 24 hours:    Intake/Output from previous day: 06/09 0701 - 06/10 0700 In: 120 [P.O.:120] Out: 530 [Urine:400; Chest Tube:130] Intake/Output this shift: No intake/output data recorded.  General appearance: alert, cooperative, and no distress Neurologic: intact Heart: irregularly irregular rhythm Lungs: clear to auscultation bilaterally Abdomen: soft, non-tender; bowel sounds normal; no masses,  no organomegaly Extremities: extremities normal, atraumatic, no cyanosis or edema Wound: Clean and dry incisions with clean and dry dressing around chest tube. Subcutaneous emphysema of the left chest  Lab Results: No results for input(s): "WBC", "HGB", "HCT", "PLT" in the last 72 hours. BMET:  Recent Labs    06/10/23 0215  NA 136  K 4.2  CL 103  CO2 27  GLUCOSE 104*  BUN 14  CREATININE 0.97  CALCIUM 8.5*    PT/INR: No results for input(s): "LABPROT", "INR" in the last 72 hours. ABG    Component Value Date/Time   PHART 7.344 (L) 06/07/2023 1248   HCO3 25.4 06/07/2023 1248   TCO2 27 06/07/2023 1248   ACIDBASEDEF 1.0 06/07/2023 1248   O2SAT 100 06/07/2023 1248   CBG (last 3)  No results for input(s): "GLUCAP" in the last 72  hours.  Assessment/Plan: S/P Procedure(s) (LRB): LOBECTOMY, LUNG, ROBOT-ASSISTED, USING VATS (Left) BLOCK, NERVE, INTERCOSTAL (Left) BIOPSY, LYMPH NODE (Left)  Neuro: Patient's pain is overall controlled, hx of chronic pain managed with narcotics at home. Continue Tylenol , Gabapentin , Baclofen , Oxycodone  PRN and Fentanyl  PRN.   CV: Hypotension, SBP 97-06, stable. Afib with RVR, HR 120s-130s since 5AM, no one was notified until around 7:30AM. EKG showed afib with RVR. Will start Amiodarone gtt.    Pulm: Saturating well on RA. CT output 130cc/24hrs recorded. CT with no air leak this AM on water seal. CXR with stable tiny left apical space and stable left sided subcutaneous emphysema. Continue Mucinex for cough. Encourage IS and ambulation. Possibly d/c CT today?  GI: Tolerating a diet, no N/V. +BM but no BM in a few days. Patient feels a little constipated. Will give lactulose.    Renal: Yesterday's Cr 0.97, stable. UO 400cc/24hrs recorded   ID: No leukocytosis, tmax 98.5.    Expected postop ABLA: Last H/H stable 9.2/27.8. Not clinically significant at this time.    DVT Prophylaxis: Lovenox    Dispo: Possibly d/c chest tube today, will discuss with Dr. Luna Salinas.    LOS: 5 days    Randa Burton, PA-C 06/12/2023  Patient seen and examined, agree with above Went into AF with RVR this AM Now back in Sr with amiodarone drip No air leak- dc chest tube Ambulate  Landon Pinion C. Luna Salinas, MD [facility] [phone])  [phone]  

## 2023-06-13 ENCOUNTER — Inpatient Hospital Stay (HOSPITAL_COMMUNITY)

## 2023-06-13 MED ORDER — LACTULOSE 10 GM/15ML PO SOLN
20.0000 g | Freq: Once | ORAL | Status: AC
  Administered 2023-06-13: 20 g via ORAL
  Filled 2023-06-13: qty 30

## 2023-06-13 MED ORDER — AMIODARONE HCL 200 MG PO TABS
400.0000 mg | ORAL_TABLET | Freq: Two times a day (BID) | ORAL | Status: DC
  Administered 2023-06-13 – 2023-06-14 (×3): 400 mg via ORAL
  Filled 2023-06-13 (×3): qty 2

## 2023-06-13 NOTE — Progress Notes (Signed)
 Physical Therapy Treatment Patient Details Name: Miguel Gutierrez MRN: [id number] DOB: Aug 29, 1960 Today's Date: 06/13/2023   History of Present Illness 63 year old man presented 06/07/23 with diagnosis of LLL nodule for lobectomy. PMH tobacco abuse, COPD, degenerative joint disease, chronic back pain, chronic narcotic dependence, coronary and aortic atherosclerosis,    PT Comments  Pt resting in bed on arrival and agreeable to session with great progress towards acute goals. Pt neighbor calling during session and pt and neighbor endorsing ample assist post acutely. Pt demonstrating increased activity tolerance progressing gait distance 150' with RW for support and grossly CGA for safety. Pt able to maintain SpO2 >90% on RA throughout session. Educated pt on importance of continued mobility with pt verbalizing understanding. Pt continues to benefit from skilled PT services to progress toward functional mobility goals.        If plan is discharge home, recommend the following: A little help with walking and/or transfers;A little help with bathing/dressing/bathroom;Assistance with cooking/housework;Assist for transportation;Help with stairs or ramp for entrance   Can travel by private vehicle        Equipment Recommendations  None recommended by PT    Recommendations for Other Services       Precautions / Restrictions Precautions Precautions: None Restrictions Weight Bearing Restrictions Per Provider Order: No     Mobility  Bed Mobility Overal bed mobility: Needs Assistance Bed Mobility: Supine to Sit     Supine to sit: HOB elevated, Used rails, Supervision     General bed mobility comments: supervision for safety    Transfers Overall transfer level: Needs assistance Equipment used: Rolling walker (2 wheels) Transfers: Sit to/from Stand Sit to Stand: Contact guard assist           General transfer comment: no assist needed    Ambulation/Gait Ambulation/Gait assistance:  Contact guard assist Gait Distance (Feet): 150 Feet Assistive device: Rolling walker (2 wheels) Gait Pattern/deviations: Step-to pattern, Decreased stride length, Trunk flexed Gait velocity: decr     General Gait Details: slow gaurded but steady gait, cues for upright posture and closer RW proximity   Stairs             Wheelchair Mobility     Tilt Bed    Modified Rankin (Stroke Patients Only)       Balance Overall balance assessment: Mild deficits observed, not formally tested                                          Communication Communication Communication: No apparent difficulties  Cognition Arousal: Alert Behavior During Therapy: WFL for tasks assessed/performed   PT - Cognitive impairments: No apparent impairments                         Following commands: Intact      Cueing Cueing Techniques: Verbal cues  Exercises      General Comments General comments (skin integrity, edema, etc.): VSS on RA, spoke with neightbor over phone as she called pt during session with pt and neighbor reporting ample home assist      Pertinent Vitals/Pain Pain Assessment Pain Assessment: Faces Faces Pain Scale: Hurts little more Pain Location: incision, low back, neck Pain Descriptors / Indicators: Discomfort, Guarding, Grimacing Pain Intervention(s): Monitored during session, Limited activity within patient's tolerance    Home Living  Prior Function            PT Goals (current goals can now be found in the care plan section) Acute Rehab PT Goals Patient Stated Goal: return home if he has enough assistance PT Goal Formulation: With patient Time For Goal Achievement: 06/26/23 Progress towards PT goals: Progressing toward goals    Frequency    Min 3X/week      PT Plan      Co-evaluation              AM-PAC PT 6 Clicks Mobility   Outcome Measure  Help needed turning from your  back to your side while in a flat bed without using bedrails?: None Help needed moving from lying on your back to sitting on the side of a flat bed without using bedrails?: A Little Help needed moving to and from a bed to a chair (including a wheelchair)?: A Little Help needed standing up from a chair using your arms (e.g., wheelchair or bedside chair)?: A Little Help needed to walk in hospital room?: A Little Help needed climbing 3-5 steps with a railing? : A Little 6 Click Score: 19    End of Session   Activity Tolerance: Patient tolerated treatment well Patient left: in bed;with call bell/phone within reach;Other (comment) (seated EOB) Nurse Communication: Mobility status PT Visit Diagnosis: Other abnormalities of gait and mobility (R26.89);Muscle weakness (generalized) (M62.81);Pain Pain - Right/Left: Left Pain - part of body:  (thorax)     Time: 0981-1914 PT Time Calculation (min) (ACUTE ONLY): 25 min  Charges:    $Gait Training: 23-37 mins PT General Charges $$ ACUTE PT VISIT: 1 Visit                     Pleasant Bensinger R. PTA Acute Rehabilitation Services Office: [phone]   Agapito Horseman 06/13/2023, 3:41 PM

## 2023-06-13 NOTE — Progress Notes (Addendum)
      [address]       [place] [address]             [phone]      6 Days Post-Op Procedure(s) (LRB): LOBECTOMY, LUNG, ROBOT-ASSISTED, USING VATS (Left) BLOCK, NERVE, INTERCOSTAL (Left) BIOPSY, LYMPH NODE (Left) Subjective: Patient states he is feeling better this AM  Objective: Vital signs in last 24 hours: Temp:  [98 F (36.7 C)-99 F (37.2 C)] 98 F (36.7 C) (06/11 0401) Pulse Rate:  [77-85] 77 (06/11 0401) Cardiac Rhythm: Normal sinus rhythm (06/10 1906) Resp:  [16-19] 19 (06/10 2300) BP: (91-106)/(58-72) 91/58 (06/11 0401) SpO2:  [92 %-95 %] 95 % (06/11 0401)  Hemodynamic parameters for last 24 hours:    Intake/Output from previous day: 06/10 0701 - 06/11 0700 In: 1098.4 [P.O.:600; I.V.:498.4] Out: 1800 [Urine:1800] Intake/Output this shift: Total I/O In: 66.8 [I.V.:66.8] Out: -   General appearance: alert, cooperative, and no distress Neurologic: intact Heart: regular rate and rhythm, S1, S2 normal, no murmur, click, rub or gallop Lungs: clear to auscultation bilaterally Abdomen: soft, non-tender; bowel sounds normal; no masses,  no organomegaly Extremities: extremities normal, atraumatic, no cyanosis or edema Wound: Clean and dry without sign of infection  Lab Results: No results for input(s): WBC, HGB, HCT, PLT in the last 72 hours. BMET: No results for input(s): NA, K, CL, CO2, GLUCOSE, BUN, CREATININE, CALCIUM in the last 72 hours.  PT/INR: No results for input(s): LABPROT, INR in the last 72 hours. ABG    Component Value Date/Time   PHART 7.344 (L) 06/07/2023 1248   HCO3 25.4 06/07/2023 1248   TCO2 27 06/07/2023 1248   ACIDBASEDEF 1.0 06/07/2023 1248   O2SAT 100 06/07/2023 1248   CBG (last 3)  No results for input(s): GLUCAP in the last 72 hours.  Assessment/Plan: S/P Procedure(s) (LRB): LOBECTOMY, LUNG, ROBOT-ASSISTED, USING VATS (Left) BLOCK, NERVE, INTERCOSTAL (Left) BIOPSY, LYMPH  NODE (Left)  Neuro: Patient's pain is overall controlled this AM, hx of chronic pain managed with narcotics at home. Continue Tylenol , Gabapentin , Baclofen , Oxycodone  PRN and Fentanyl  PRN.   CV: Hypotension, SBP 91-102, stable. Hx of afib with RVR converted to NSR. NSR, HR 80s this AM. Will transition to PO Amiodarone.   Pulm: Saturating 91-95% on RA. CT removed yesterday. CXR with stable tiny left apical space and stable left sided subcutaneous emphysema. Continue Mucinex for cough. Encourage IS and ambulation.    GI: Tolerating a diet, no N/V. +BM but no BM in a few days. Patient feels a little constipated. Will give lactulose.    Renal: Yesterday's Cr 0.97, stable. UO 400cc/24hrs recorded   ID: No leukocytosis, tmax 98.5.    Expected postop ABLA: Last H/H stable 9.2/27.8. Not clinically significant at this time.    DVT Prophylaxis: Lovenox   Deconditioning: PT recommended SNF but patient refuses to go to SNF. Will have some help from neighbor. Will order [facility] PT/OT.   Dispo: Transition to PO Amiodarone. Hopefully can d/c tomorrow AM.    LOS: 6 days    Randa Burton, PA-C 06/13/2023 Patient seen and examined, agree with above In SR- convert amiodarone to PO Will stop Fentanyl  as he should be going home soon Path T1b, N0- stage IA squamous cell carcinoma  Landon Pinion C. Luna Salinas, MD [facility] [phone]

## 2023-06-13 NOTE — TOC Initial Note (Signed)
 Transition of Care [facility]) - Initial/Assessment Note    Patient Details  Name: Miguel Gutierrez MRN: [id number] Date of Birth: Mar 30, 1960  [facility]) CM/SW Contact:    Miguel Mill, RN Phone Number: 06/13/2023, 4:16 PM  Clinical Narrative:                 Spoke to patient regarding transition needs.  Patient is agreeable to home health. This RNCM offered choice for Home Health, Miguel Gutierrez states he has no preference, RNCM made referral to [facility] with Miguel Gutierrez, He is able to take referral.  Patient has transportation for discharge tomorrow.  Expected Discharge Plan: Home w Home Health Services Barriers to Discharge: Continued Medical Work up   Patient Goals and CMS Choice Patient states their goals for this hospitalization and ongoing recovery are:: REturn home CMS Medicare.gov Compare Post Acute Care list provided to:: Patient Choice offered to / list presented to : Patient      Expected Discharge Plan and Services   Discharge Planning Services: CM Consult Post Acute Care Choice: Home Health Living arrangements for the past 2 months: Single Family Home                           HH Arranged: PT, OT HH Agency: [facility] Date [facility] Agency Contacted: 06/13/23 Time HH Agency Contacted: 1614 Representative spoke with at [facility] Miguel Gutierrez Agency: Miguel Gutierrez  Prior Living Arrangements/Services Living arrangements for the past 2 months: Single Family Home Lives with:: Self Patient language and need for interpreter reviewed:: Yes Do you feel safe going back to the place where you live?: Yes      Need for Family Participation in Patient Care: Yes (Comment) Care giver support system in place?: Yes (comment)   Criminal Activity/Legal Involvement Pertinent to Current Situation/Hospitalization: No - Comment as needed  Activities of Daily Living   ADL Screening (condition at time of admission) Independently performs ADLs?: Yes (appropriate for developmental age) Is the  patient deaf or have difficulty hearing?: No Does the patient have difficulty seeing, even when wearing glasses/contacts?: No Does the patient have difficulty concentrating, remembering, or making decisions?: No  Permission Sought/Granted                  Emotional Assessment Appearance:: Appears stated age Attitude/Demeanor/Rapport: Engaged Affect (typically observed): Accepting Orientation: : Oriented to Self, Oriented to Place, Oriented to  Time, Oriented to Situation Alcohol / Substance Use: Not Applicable Psych Involvement: No (comment)  Admission diagnosis:  Lung nodules [R91.8] 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 Miguel Gutierrez [address] [place] [address] Phone: [phone] Fax: [phone]     Social Drivers of Health (SDOH) Social History: SDOH Screenings   Food Insecurity: No Food Insecurity (06/07/2023)  Housing: Low Risk  (06/07/2023)  Transportation Needs: No Transportation Needs (06/07/2023)  Utilities: Not At Risk (06/07/2023)  Social Connections: Unknown (06/07/2023)  Tobacco Use: High Risk (06/07/2023)   SDOH Interventions:     Readmission Risk Interventions     No data to display

## 2023-06-13 NOTE — Evaluation (Signed)
 Occupational Therapy Evaluation Patient Details Name: Miguel Gutierrez MRN: [id number] DOB: 1960-12-31 Today's Date: 06/13/2023   History of Present Illness   63 year old man presented 06/07/23 with diagnosis of LLL nodule for lobectomy. PMH tobacco abuse, COPD, degenerative joint disease, chronic back pain, chronic narcotic dependence, coronary and aortic atherosclerosis,     Clinical Impressions Pt's functional abilities and mobility are dependent on his pain level. He walks with either a cane or RW and will also use a w/c, depending on the day. Pt presents with chronic back and neck pain and acute pain in L flank. Pt requires up to supervision for ADLs and mobility with RW. He is able to perform figure 4 to reach his feet this date and reports his ability to perform LB bathing and dressing is dependent on his pain level in a given day. Will follow to instruct in use of AE and energy conservation strategies. Recommending HHOT to address IADLs. Pt has had aides in the past to help with household activities, but not currently. His neighbors have arranged his home for w/c accessibility and will be assisting with errands and transportation upon discharge.      If plan is discharge home, recommend the following:   Assistance with cooking/housework;Assist for transportation;Help with stairs or ramp for entrance     Functional Status Assessment   Patient has had a recent decline in their functional status and demonstrates the ability to make significant improvements in function in a reasonable and predictable amount of time.     Equipment Recommendations   None recommended by OT     Recommendations for Other Services         Precautions/Restrictions   Precautions Precautions: Fall Restrictions Weight Bearing Restrictions Per Provider Order: No     Mobility Bed Mobility Overal bed mobility: Needs Assistance Bed Mobility: Supine to Sit, Sit to Supine     Supine to sit:  Supervision Sit to supine: Supervision        Transfers Overall transfer level: Needs assistance Equipment used: Rolling walker (2 wheels) Transfers: Sit to/from Stand Sit to Stand: Supervision           General transfer comment: no assist needed      Balance Overall balance assessment: Mild deficits observed, not formally tested                                         ADL either performed or assessed with clinical judgement   ADL Overall ADL's : Needs assistance/impaired Eating/Feeding: Independent   Grooming: Wash/dry hands;Standing;Supervision/safety   Upper Body Bathing: Set up;Sitting   Lower Body Bathing: Supervison/ safety;Sit to/from stand   Upper Body Dressing : Set up;Sitting   Lower Body Dressing: Supervision/safety;Sit to/from stand   Toilet Transfer: Supervision/safety;Ambulation;Rolling walker (2 wheels)           Functional mobility during ADLs: Supervision/safety;Rolling walker (2 wheels)       Vision Ability to See in Adequate Light: 0 Adequate Patient Visual Report: No change from baseline       Perception         Praxis         Pertinent Vitals/Pain Pain Assessment Pain Assessment: Faces Faces Pain Scale: Hurts little more Pain Location: incision, low back, neck Pain Descriptors / Indicators: Discomfort, Guarding, Grimacing Pain Intervention(s): Monitored during session, Repositioned, Patient requesting pain meds-RN notified     Extremity/Trunk  Assessment Upper Extremity Assessment Upper Extremity Assessment: Overall WFL for tasks assessed   Lower Extremity Assessment Lower Extremity Assessment: Defer to PT evaluation   Cervical / Trunk Assessment Cervical / Trunk Assessment: Kyphotic;Other exceptions Cervical / Trunk Exceptions: chronic back pain   Communication Communication Communication: No apparent difficulties   Cognition Arousal: Alert Behavior During Therapy: WFL for tasks  assessed/performed Cognition: No apparent impairments                               Following commands: Intact       Cueing  General Comments   Cueing Techniques: Verbal cues  VSS on RA, spoke with neightbor over phone as she called pt during session with pt and neighbor reporting ample home assist   Exercises     Shoulder Instructions      Home Living Family/patient expects to be discharged to:: Private residence Living Arrangements: Alone Available Help at Discharge: Neighbor;Available PRN/intermittently Type of Home: House Home Access: Stairs to enter [employer] of Steps: 8 Entrance Stairs-Rails: Right;Left;Can reach both Home Layout: One level     Bathroom Shower/Tub: Chief Strategy Officer: Handicapped height     Home Equipment: Agricultural consultant (2 wheels);Wheelchair - manual;Grab bars - tub/shower;Grab bars - toilet;Shower seat;Cane - single point          Prior Functioning/Environment Prior Level of Function : Needs assist             Mobility Comments: alternates between cane, walker, w/c depending on how he is feeling ADLs Comments: difficulty grocery shopping and sometimes neighbor does; used to have aide (theft and released them)    OT Problem List: Pain;Impaired balance (sitting and/or standing)   OT Treatment/Interventions: Self-care/ADL training;DME and/or AE instruction;Patient/family education;Therapeutic activities      OT Goals(Current goals can be found in the care plan section)   Acute Rehab OT Goals OT Goal Formulation: With patient Time For Goal Achievement: 06/27/23 Potential to Achieve Goals: Good ADL Goals Pt Will Transfer to Toilet: with modified independence;ambulating Additional ADL Goal #1: Pt will complete basic ADLs mod I. Additional ADL Goal #2: Pt will be knowledgeable in use of AE for LB ADLs.   OT Frequency:  Min 2X/week    Co-evaluation              AM-PAC OT 6  Clicks Daily Activity     Outcome Measure Help from another person eating meals?: None Help from another person taking care of personal grooming?: A Little Help from another person toileting, which includes using toliet, bedpan, or urinal?: A Little Help from another person bathing (including washing, rinsing, drying)?: None Help from another person to put on and taking off regular upper body clothing?: A Little   6 Click Score: 17   End of Session Equipment Utilized During Treatment: Rolling walker (2 wheels) Nurse Communication: Patient requests pain meds  Activity Tolerance: Patient tolerated treatment well Patient left: in bed;with call bell/phone within reach  OT Visit Diagnosis: Unsteadiness on feet (R26.81);Pain                Time: 1550-1615 OT Time Calculation (min): 25 min Charges:  OT General Charges $OT Visit: 1 Visit OT Evaluation $OT Eval Low Complexity: 1 Low OT Treatments $Self Care/Home Management : 8-22 mins Avanell Leigh, OTR/L Acute Rehabilitation Services Office: [phone]   Jonette Nestle 06/13/2023, 4:34 PM

## 2023-06-14 ENCOUNTER — Inpatient Hospital Stay (HOSPITAL_COMMUNITY)

## 2023-06-14 ENCOUNTER — Other Ambulatory Visit (HOSPITAL_COMMUNITY): Payer: Self-pay

## 2023-06-14 MED ORDER — AMIODARONE HCL 200 MG PO TABS
ORAL_TABLET | ORAL | 1 refills | Status: DC
  Filled 2023-06-14: qty 60, 32d supply, fill #0

## 2023-06-14 MED ORDER — GABAPENTIN 300 MG PO CAPS
300.0000 mg | ORAL_CAPSULE | Freq: Two times a day (BID) | ORAL | 1 refills | Status: DC
  Filled 2023-06-14: qty 60, 30d supply, fill #0

## 2023-06-14 NOTE — TOC Transition Note (Signed)
 Transition of Care [facility]) - Discharge Note   Patient Details  Name: Miguel Gutierrez MRN: [id number] Date of Birth: [birth year]  [facility]) CM/SW Contact:  Jeani Mill, RN Phone Number: 06/14/2023, 11:54 AM   Clinical Narrative:    Patient stable for discharge.  Cory with [facility] notified of discharge.  Follow up apt on AVS.   Final next level of care: Home w Home Health Services Barriers to Discharge: Barriers Resolved   Patient Goals and CMS Choice Patient states their goals for this hospitalization and ongoing recovery are:: REturn home CMS Medicare.gov Compare Post Acute Care list provided to:: Patient Choice offered to / list presented to : Patient      Discharge Placement             Home          Discharge Plan and Services Additional resources added to the After Visit Summary for     Discharge Planning Services: CM Consult Post Acute Care Choice: Home Health                    HH Arranged: PT, OT [facility] Agency: [facility] Date [facility] Agency Contacted: 06/13/23 Time HH Agency Contacted: 1614 Representative spoke with at [facility] Agency: [name]  Social Drivers of Health (SDOH) Interventions SDOH Screenings   Food Insecurity: No Food Insecurity (06/07/2023)  Housing: Low Risk  (06/07/2023)  Transportation Needs: No Transportation Needs (06/07/2023)  Utilities: Not At Risk (06/07/2023)  Social Connections: Unknown (06/07/2023)  Tobacco Use: High Risk (06/07/2023)     Readmission Risk Interventions    06/14/2023   11:54 AM  Readmission Risk Prevention Plan  Post Dischage Appt Complete  Medication Screening Complete  Transportation Screening Complete

## 2023-06-14 NOTE — Progress Notes (Signed)
 Physical Therapy Treatment Patient Details Name: Miguel Gutierrez MRN: [id number] DOB: [birth year] Today's Date: 06/14/2023   History of Present Illness 63 year old man presented 06/07/23 with diagnosis of LLL nodule for lobectomy. PMH tobacco abuse, COPD, degenerative joint disease, chronic back pain, chronic narcotic dependence, coronary and aortic atherosclerosis,   PT Comments  Pt in bed upon arrival and agreeable to PT session. Focused today's session on increasing gait distance and stair negotiation. Pt was able to ambulate 240 ft with RW and supervision for safety. He was then able to ascend/descend 4 steps with use of RW. Pt will have assist from neighbor for safety when entering his home. Pt is eager to go home and has no further questions or concerns. Recommending HHPT to work towards independence with mobility. Acute PT to follow.     If plan is discharge home, recommend the following: A little help with walking and/or transfers;A little help with bathing/dressing/bathroom;Assistance with cooking/housework;Assist for transportation;Help with stairs or ramp for entrance   Can travel by private vehicle     Yes  Equipment Recommendations  None recommended by PT       Precautions / Restrictions Precautions Precautions: Fall Restrictions Weight Bearing Restrictions Per Provider Order: No     Mobility  Bed Mobility Overal bed mobility: Needs Assistance Bed Mobility: Supine to Sit, Sit to Supine    Supine to sit: Supervision Sit to supine: Supervision      Transfers Overall transfer level: Needs assistance Equipment used: Rolling walker (2 wheels) Transfers: Sit to/from Stand Sit to Stand: Supervision    General transfer comment: no assist needed    Ambulation/Gait Ambulation/Gait assistance: Supervision Gait Distance (Feet): 240 Feet Assistive device: Rolling walker (2 wheels) Gait Pattern/deviations: Step-to pattern, Decreased stride length, Trunk flexed     General  Gait Details: slow and steady gait. Forward flexed posture with difficulty staying close to RW   Stairs Stairs: Yes Stairs assistance: Contact guard assist Stair Management: Step to pattern, Forwards, With walker Number of Stairs: 4 General stair comments: preferred to use RW with CGA for safety. x1 seated rest break after decending 4 steps due to feeling SOB     Balance Overall balance assessment: Mild deficits observed, not formally tested       Communication Communication Communication: No apparent difficulties  Cognition Arousal: Alert Behavior During Therapy: WFL for tasks assessed/performed   PT - Cognitive impairments: No apparent impairments    Following commands: Intact      Cueing Cueing Techniques: Verbal cues     General Comments General comments (skin integrity, edema, etc.): VSS on RA      Pertinent Vitals/Pain Pain Assessment Pain Assessment: Faces Faces Pain Scale: Hurts little more Pain Location: incision, low back, neck Pain Descriptors / Indicators: Discomfort, Guarding, Grimacing Pain Intervention(s): Limited activity within patient's tolerance, Monitored during session, Repositioned     PT Goals (current goals can now be found in the care plan section) Acute Rehab PT Goals PT Goal Formulation: With patient Time For Goal Achievement: 06/26/23 Potential to Achieve Goals: Good Progress towards PT goals: Progressing toward goals    Frequency    Min 3X/week       AM-PAC PT 6 Clicks Mobility   Outcome Measure  Help needed turning from your back to your side while in a flat bed without using bedrails?: None Help needed moving from lying on your back to sitting on the side of a flat bed without using bedrails?: A Little Help needed moving to and  from a bed to a chair (including a wheelchair)?: A Little Help needed standing up from a chair using your arms (e.g., wheelchair or bedside chair)?: A Little Help needed to walk in hospital room?:  A Little Help needed climbing 3-5 steps with a railing? : A Little 6 Click Score: 19    End of Session Equipment Utilized During Treatment: Gait belt Activity Tolerance: Patient tolerated treatment well Patient left: in bed;with call bell/phone within reach;Other (comment) (seated EOB) Nurse Communication: Mobility status PT Visit Diagnosis: Other abnormalities of gait and mobility (R26.89);Muscle weakness (generalized) (M62.81);Pain Pain - Right/Left: Left Pain - part of body:  (thorax)     Time: 1610-9604 PT Time Calculation (min) (ACUTE ONLY): 21 min  Charges:    $Therapeutic Activity: 8-22 mins PT General Charges $$ ACUTE PT VISIT: 1 Visit                     Orysia Blas, PT, DPT Secure Chat Preferred  Rehab Office [phone]   Alissa April Adela Ades 06/14/2023, 8:34 AM

## 2023-06-14 NOTE — Progress Notes (Signed)
 Went over discharge paperwork with patient. All questions answered. PIV/telemetry removed and all belongings at bedside.

## 2023-06-14 NOTE — Progress Notes (Addendum)
      [address]       [employer] [address]             [phone]      7 Days Post-Op Procedure(s) (LRB): LOBECTOMY, LUNG, ROBOT-ASSISTED, USING VATS (Left) BLOCK, NERVE, INTERCOSTAL (Left) BIOPSY, LYMPH NODE (Left) Subjective: Patient sitting up in bed states he is ready to get out of here  Objective: Vital signs in last 24 hours: Temp:  [98 F (36.7 C)-99 F (37.2 C)] 98.2 F (36.8 C) (06/12 0241) Pulse Rate:  [87-90] 90 (06/12 0000) Cardiac Rhythm: Normal sinus rhythm (06/11 2000) Resp:  [17-20] 18 (06/12 0241) BP: (92-112)/(64-74) 92/67 (06/12 0241) SpO2:  [91 %-94 %] 92 % (06/12 0241)  Hemodynamic parameters for last 24 hours:    Intake/Output from previous day: 06/11 0701 - 06/12 0700 In: 979.3 [P.O.:840; I.V.:139.3] Out: 800 [Urine:800] Intake/Output this shift: No intake/output data recorded.  General appearance: alert, cooperative, and no distress Neurologic: intact Heart: regular rate and rhythm, S1, S2 normal, no murmur, click, rub or gallop Lungs: clear to auscultation bilaterally Abdomen: soft, non-tender; bowel sounds normal; no masses,  no organomegaly Extremities: extremities normal, atraumatic, no cyanosis or edema Wound: Clean and dry without sign of infection, stable left sided subcutaneous emphysema  Lab Results: No results for input(s): WBC, HGB, HCT, PLT in the last 72 hours. BMET: No results for input(s): NA, K, CL, CO2, GLUCOSE, BUN, CREATININE, CALCIUM in the last 72 hours.  PT/INR: No results for input(s): LABPROT, INR in the last 72 hours. ABG    Component Value Date/Time   PHART 7.344 (L) 06/07/2023 1248   HCO3 25.4 06/07/2023 1248   TCO2 27 06/07/2023 1248   ACIDBASEDEF 1.0 06/07/2023 1248   O2SAT 100 06/07/2023 1248   CBG (last 3)  No results for input(s): GLUCAP in the last 72 hours.  Assessment/Plan: S/P Procedure(s) (LRB): LOBECTOMY, LUNG, ROBOT-ASSISTED, USING VATS  (Left) BLOCK, NERVE, INTERCOSTAL (Left) BIOPSY, LYMPH NODE (Left)  Neuro: Patient's pain is overall controlled this AM, hx of chronic pain managed with narcotics at home. Continue Tylenol , Gabapentin , Baclofen , Oxycodone  PRN. Will continue home meds at discharge.   CV: Hypotension, SBP 92-112, stable. Hx of afib with RVR converted to NSR. NSR, HR 80s this AM on PO Amiodarone.    Pulm: Saturating 91-95% on RA. CXR with stable tiny left apical space, stable left sided subcutaneous emphysema and stable residual left lower lobe infiltrates, looks like surgical changes. On Mucinex for cough, coughing up clear blood tinged sputum. Encourage IS and ambulation.    GI: Tolerating a diet, no N/V. +BM yesterday.   Renal: Last Cr 0.97, stable. UO 800cc/24hrs recorded   ID: No leukocytosis, tmax 99, but mostly 98.    DVT Prophylaxis: Lovenox    Deconditioning: PT recommended SNF but patient refuses to go to SNF. Will have some help from neighbor. Will order [facility] PT/OT. Ambulation progressing   Dispo: Has remained in NSR on PO Amiodarone and CXR is stable, will d/c home today.    LOS: 7 days    Randa Burton, PA-C 06/14/2023  Patient seen and examined, agree with findings and plan outlined above  Landon Pinion C. Luna Salinas, MD [facility] [phone]

## 2023-06-14 NOTE — Progress Notes (Signed)
 Occupational Therapy Treatment Patient Details Name: Miguel Gutierrez MRN: [id number] DOB: July 15, 1960 Today's Date: 06/14/2023   History of present illness 63 year old man presented 06/07/23 with diagnosis of LLL nodule for lobectomy. PMH tobacco abuse, COPD, degenerative joint disease, chronic back pain, chronic narcotic dependence, coronary and aortic atherosclerosis,   OT comments  Pt educated in energy conservation strategies and use of AE for LB ADLs. Pt returned demonstration in use. Pt walking about his room with [facility] and some reaching for furniture as he prepares to discharge home.       If plan is discharge home, recommend the following:  Assistance with cooking/housework;Assist for transportation;Help with stairs or ramp for entrance   Equipment Recommendations  None recommended by OT    Recommendations for Other Services      Precautions / Restrictions Precautions Precautions: Fall Restrictions Weight Bearing Restrictions Per Provider Order: No       Mobility Bed Mobility                    Transfers   Equipment used: Straight cane                     Balance Overall balance assessment: Mild deficits observed, not formally tested                                         ADL either performed or assessed with clinical judgement   ADL Overall ADL's : Needs assistance/impaired               Lower Body Bathing Details (indicate cue type and reason): educated in use of long handled bath sponge and reacher     Lower Body Dressing: Modified independent;Sitting/lateral leans Lower Body Dressing Details (indicate cue type and reason): educated in use of sock aid and reacher Toilet Transfer: Modified Independent;Ambulation   Toileting- Clothing Manipulation and Hygiene: Modified independent       Functional mobility during ADLs: Modified independent;Cane      Extremity/Trunk Assessment              Vision        Perception     Praxis     Communication Communication Communication: No apparent difficulties   Cognition Arousal: Alert Behavior During Therapy: WFL for tasks assessed/performed Cognition: No apparent impairments                               Following commands: Intact        Cueing   Cueing Techniques: Verbal cues  Exercises      Shoulder Instructions       General Comments VSS on RA    Pertinent Vitals/ Pain       Pain Assessment Pain Assessment: Faces Faces Pain Scale: Hurts little more Pain Location: incision, low back, neck Pain Descriptors / Indicators: Discomfort, Guarding, Grimacing Pain Intervention(s): Monitored during session, Repositioned  Home Living                                          Prior Functioning/Environment              Frequency  Min 2X/week        Progress Toward Goals  OT Goals(current goals can now be found in the care plan section)  Progress towards OT goals: Progressing toward goals  Acute Rehab OT Goals OT Goal Formulation: With patient Time For Goal Achievement: 06/27/23 Potential to Achieve Goals: Good  Plan      Co-evaluation                 AM-PAC OT 6 Clicks Daily Activity     Outcome Measure   Help from another person eating meals?: None Help from another person taking care of personal grooming?: None Help from another person toileting, which includes using toliet, bedpan, or urinal?: None Help from another person bathing (including washing, rinsing, drying)?: None Help from another person to put on and taking off regular upper body clothing?: None Help from another person to put on and taking off regular lower body clothing?: None 6 Click Score: 24    End of Session    OT Visit Diagnosis: Unsteadiness on feet (R26.81);Pain   Activity Tolerance Patient tolerated treatment well   Patient Left in bed;with call bell/phone within reach   Nurse  Communication          Time: 1478-2956 OT Time Calculation (min): 17 min  Charges: OT General Charges $OT Visit: 1 Visit OT Treatments $Self Care/Home Management : 8-22 mins  Avanell Leigh, OTR/L Acute Rehabilitation Services Office: [phone]  Jonette Nestle 06/14/2023, 9:27 AM

## 2023-06-18 ENCOUNTER — Other Ambulatory Visit (HOSPITAL_COMMUNITY): Payer: Self-pay

## 2023-06-20 ENCOUNTER — Ambulatory Visit: Admitting: Thoracic Surgery (Cardiothoracic Vascular Surgery)

## 2023-06-20 DIAGNOSIS — Z483 Aftercare following surgery for neoplasm: Secondary | ICD-10-CM | POA: Diagnosis not present

## 2023-06-21 ENCOUNTER — Other Ambulatory Visit: Payer: Self-pay

## 2023-06-22 ENCOUNTER — Other Ambulatory Visit: Payer: Self-pay | Admitting: Thoracic Surgery (Cardiothoracic Vascular Surgery)

## 2023-06-22 DIAGNOSIS — R911 Solitary pulmonary nodule: Secondary | ICD-10-CM

## 2023-06-23 NOTE — Progress Notes (Signed)
 The proposed treatment discussed in conference is for discussion purpose only and is not a binding recommendation.  The patients have not been physically examined, or presented with their treatment options.  Therefore, final treatment plans cannot be decided.

## 2023-06-26 ENCOUNTER — Ambulatory Visit (HOSPITAL_COMMUNITY)
Admission: RE | Admit: 2023-06-26 | Discharge: 2023-06-26 | Disposition: A | Discharge status: Home / Self Care | Source: Ambulatory Visit | Attending: Internal Medicine | Admitting: Internal Medicine

## 2023-06-26 ENCOUNTER — Ambulatory Visit
Payer: Self-pay | Attending: Thoracic Surgery (Cardiothoracic Vascular Surgery) | Admitting: Thoracic Surgery (Cardiothoracic Vascular Surgery)

## 2023-06-26 ENCOUNTER — Encounter: Payer: Self-pay | Admitting: Thoracic Surgery (Cardiothoracic Vascular Surgery)

## 2023-06-26 VITALS — BP 112/76 | HR 77 | Resp 20 | Ht 70.0 in | Wt 124.9 lb

## 2023-06-26 DIAGNOSIS — R911 Solitary pulmonary nodule: Secondary | ICD-10-CM | POA: Insufficient documentation

## 2023-06-26 DIAGNOSIS — J439 Emphysema, unspecified: Secondary | ICD-10-CM | POA: Diagnosis not present

## 2023-06-26 DIAGNOSIS — Z09 Encounter for follow-up examination after completed treatment for conditions other than malignant neoplasm: Secondary | ICD-10-CM

## 2023-06-26 NOTE — Progress Notes (Signed)
 [address] [name] [address]             [phone]     HPI: Mr. Miguel Gutierrez returns for a scheduled postoperative follow-up visit after recent lobectomy.  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  Thought to have a left lower lobe lung nodule on a low-dose CT for lung cancer screening.  PET/CT in January showed a 2 cm nodule which was hypermetabolic with an SUV of 5.  No evidence of hilar or mediastinal adenopathy.  Despite his COPD he did have adequate pulmonary function to tolerate a lobectomy.  I encouraged him to consider biopsy and radiation but he very strongly preferred surgery.  I did a robotic assisted left lower lobectomy on 06/07/2023.  Final pathology showed a pT1b,pN0, stage Ia squamous cell carcinoma.  Postoperatively he had atrial fibrillation.  He converted to sinus and went home on amiodarone .  He went home on postoperative day 12.  He complains of feeling tired.  He gets short of breath with relatively little exertion.  Has had several falls including one fall a few days ago where he hit his left chest.  No syncope.  Says one of his legs will just give out.  Past Medical History:  Diagnosis Date   Arthritis    Chronic back pain    Chronic neck pain    COPD (chronic obstructive pulmonary disease) (HCC)    S/P cervical spinal fusion    C3-7    Current Outpatient Medications  Medication Sig Dispense Refill   albuterol  (VENTOLIN  HFA) 108 (90 Base) MCG/ACT inhaler Inhale 2 puffs into the lungs every 6 (six) hours as needed for shortness of breath or wheezing.     amiodarone  (PACERONE ) 200 MG tablet Take 2 tablets twice per day for 7 days, then take 1 tablet twice per day for 7 days, then take 1 tablet daily thereafter 60 tablet 1   Ascorbic Acid   (VITAMIN C ) 1000 MG tablet Take 1,000 mg by mouth daily.     baclofen  (LIORESAL ) 10 MG tablet Take 10 mg by mouth daily.     Cholecalciferol  (VITAMIN D3) 50 MCG (2000 UT) capsule Take 2,000 Units by mouth daily.     diazepam  (VALIUM ) 10 MG tablet Take 10 mg by mouth 3 (three) times daily as needed for anxiety.     gabapentin  (NEURONTIN ) 300 MG capsule Take 1 capsule (300 mg total) by mouth 2 (two) times daily. If you no longer feel you need this medication for neuropathic pain do not stop abruptly. Please decrease to 1 capsule daily for 1 week and then stop. 60 capsule 1   nicotine  (NICODERM CQ  - DOSED IN MG/24 HOURS) 14 mg/24hr patch Place 14 mg onto the skin daily as needed (nicotine  dependence).     oxyCODONE -acetaminophen  (PERCOCET) 10-325 MG tablet Take 1 tablet by mouth 4 (four) times daily as needed.     No current facility-administered medications for this visit.    Physical Exam BP 112/76 (BP Location: Left Arm, Patient Position: Sitting, Cuff Size: Normal)   Pulse 77   Resp 20   Ht 5' 10 (1.778 m)   Wt 124 lb 14.4 oz (56.7 kg)   SpO2 97% Comment: RA  BMI 17.92 kg/m  Ill-appearing 63 year old man in  no acute distress Alert and oriented x 3 Lungs diminished bilaterally, more so at left base Incisions clean dry and intact, ecchymosis left flank Cardiac regular rate and rhythm No peripheral edema  Diagnostic Tests: CHEST - 2 VIEW   COMPARISON:  Chest radiograph dated 06/14/2023.   FINDINGS: Background of emphysema. A wedge-shaped pleural-based density from the lateral left lower lung field progressed since the prior radiograph may represent loculated effusion or developing infiltrate. Interval resolution of the previously seen small left apical pneumothorax. A small left pleural effusion suspected. Stable cardiac silhouette. No acute osseous pathology.   IMPRESSION: 1. Interval resolution of the previously seen small left apical pneumothorax. 2. A wedge-shaped  pleural-based density from the lateral left lower lung field may represent loculated effusion or developing infiltrate.     Electronically Signed   By: Vanetta Chou M.D.   On: 06/26/2023 16:21 I personally reviewed the chest x-ray images.  Wedge shaped pleural density left lower chest.  No obvious rib fracture.  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  Stage Ia squamous cell carcinoma-will need oncology referral for long-term follow-up but no indication for adjuvant therapy.  Will defer to Dr. Birdena if he would like to make that referral.  He has a follow-up appointment scheduled with Dr. Birdena in about a month.  Status post left lower lobectomy-not surprised that he gets short of breath relatively quickly.   did not have great exercise tolerance prior to surgery.  Not requiring oxygen.  He does have some incisional pain.  His pain is managed by a pain clinic.  There is a opacity on his chest x-ray there is likely a loculated effusion.  Could be related to perioperative rib fractures or related to his fall where he landed on the left chest.  Will plan to get another x-ray in a couple weeks to make sure it resolves.  Biggest concern currently is these have multiple falls.  He says that 1 leg or the other will give out.  He has got some chronic back issues so could be related to that.  Has not had any syncopal episodes.    Plan: Follow-up with pain clinic Follow-up with Dr. Birdena Return in 2 weeks with PA lateral chest x-ray  Elspeth JAYSON Millers, MD [facility] [phone]

## 2023-07-10 ENCOUNTER — Other Ambulatory Visit: Payer: Self-pay | Admitting: Thoracic Surgery (Cardiothoracic Vascular Surgery)

## 2023-07-10 DIAGNOSIS — R918 Other nonspecific abnormal finding of lung field: Secondary | ICD-10-CM

## 2023-07-11 ENCOUNTER — Ambulatory Visit (HOSPITAL_COMMUNITY)
Admission: RE | Admit: 2023-07-11 | Discharge: 2023-07-11 | Disposition: A | Discharge status: Home / Self Care | Source: Ambulatory Visit | Attending: Cardiovascular Disease | Admitting: Cardiovascular Disease

## 2023-07-11 ENCOUNTER — Encounter: Payer: Self-pay | Admitting: Thoracic Surgery (Cardiothoracic Vascular Surgery)

## 2023-07-11 ENCOUNTER — Ambulatory Visit
Attending: Thoracic Surgery (Cardiothoracic Vascular Surgery) | Admitting: Thoracic Surgery (Cardiothoracic Vascular Surgery)

## 2023-07-11 VITALS — BP 87/49 | HR 67 | Resp 20 | Ht 70.0 in | Wt 125.0 lb

## 2023-07-11 DIAGNOSIS — Z09 Encounter for follow-up examination after completed treatment for conditions other than malignant neoplasm: Secondary | ICD-10-CM

## 2023-07-11 DIAGNOSIS — R918 Other nonspecific abnormal finding of lung field: Secondary | ICD-10-CM | POA: Diagnosis present

## 2023-07-11 NOTE — Progress Notes (Signed)
 [address], [name] [address]             [phone]     HPI: Miguel Gutierrez returns for 2-week follow-up.  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  He underwent a robotic assisted left lower lobectomy on 06/07/2023.  Final pathology showed a stage Ia squamous cell carcinoma.  He had atrial fibrillation postoperatively but converted to sinus rhythm with amiodarone .  He finally went home on day 12.  I saw him in the office on 06/26/2023.  He had recently had a fall.  He said he had multiple falls due to his legs giving out.  No dizziness or syncopal spells.  Is due to have back surgery in the near future.  His chest x-ray showed a wedge-shaped opacity laterally possibly due to rib fracture.  He now returns for with a follow-up chest x-ray.  In the interim since his last visit he continues to experience falls.  Has a lot of pain issues.  Sometimes cannot tell if the pain surgical or due to his back problems.  Complains of difficulty breathing when he goes outside in the heat and humidity.  Not an issue indoors.  Past Medical History:  Diagnosis Date   Arthritis    Chronic back pain    Chronic neck pain    COPD (chronic obstructive pulmonary disease) (HCC)    S/P cervical spinal fusion    C3-7      Current Outpatient Medications  Medication Sig Dispense Refill   albuterol  (VENTOLIN  HFA) 108 (90 Base) MCG/ACT inhaler Inhale 2 puffs into the lungs every 6 (six) hours as needed for shortness of breath or wheezing.     amiodarone  (PACERONE ) 200 MG tablet Take 2 tablets twice per day for 7 days, then take 1 tablet twice per day for 7 days, then take 1 tablet daily thereafter 60 tablet 1   Ascorbic Acid  (VITAMIN C ) 1000 MG tablet Take  1,000 mg by mouth daily.     baclofen  (LIORESAL ) 10 MG tablet Take 10 mg by mouth daily.     Cholecalciferol  (VITAMIN D3) 50 MCG (2000 UT) capsule Take 2,000 Units by mouth daily.     diazepam  (VALIUM ) 10 MG tablet Take 10 mg by mouth 3 (three) times daily as needed for anxiety.     gabapentin  (NEURONTIN ) 300 MG capsule Take 1 capsule (300 mg total) by mouth 2 (two) times daily. If you no longer feel you need this medication for neuropathic pain do not stop abruptly. Please decrease to 1 capsule daily for 1 week and then stop. 60 capsule 1   nicotine  (NICODERM CQ  - DOSED IN MG/24 HOURS) 14 mg/24hr patch Place 14 mg onto the skin daily as needed (nicotine  dependence).     oxyCODONE -acetaminophen  (PERCOCET) 10-325 MG tablet Take 1 tablet by mouth 4 (four) times daily as needed.     No current facility-administered medications for this visit.    Physical Exam BP (!) 87/49   Pulse 67   Resp 20   Ht 5' 10 (1.778 m)   Wt 125 lb (56.7 kg)   SpO2 95% Comment: RA  BMI 17.12 kg/m  63 year old man, frail, no acute distress Alert and oriented x 3 Lungs manage to left base,  no wheezing Incisions healing well  Diagnostic Tests: CHEST - 2 VIEW   COMPARISON:  June 26, 2023   FINDINGS: Unchanged masslike consolidation along the left lateral lung base with blunting of the left costophrenic angle. No new airspace consolidation or pleural effusion. No pneumothorax. No focal airspace consolidation, pleural effusion, or pneumothorax. No cardiomegaly.No acute fracture or destructive lesion. Anterior fusion hardware.   IMPRESSION: Unchanged masslike consolidation along the lateral left lung base with blunting of the costophrenic sulcus, possibly pleural scarring and atelectasis. Alternatively, a small left pleural effusion could also have this appearance.     Electronically Signed   By: Miguel Gutierrez M.D.   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  Squamous of carcinoma left lower lobe-stage Ia.  Status post lobectomy.  Will need oncology consultation for long-term follow-up.  He will discuss that with Dr. Birdena when he sees him next week.  Status post left lower lobectomy-respiratory issues are not surprising.  He had adequate but not great lung function preoperatively.  Obviously has less lung function immediately postoperatively.  Should improve to some degree with time but will always have some significant limitations based on his COPD.  Wedge-shaped opacity likely chest wall related or a loculated effusion.  Will repeat a chest x-ray in 6 weeks.  Plan: Follow-up with Dr. Birdena as scheduled Return in 6 weeks with PA lateral chest x-ray  Miguel JAYSON Millers, MD [facility] [phone]

## 2023-08-22 ENCOUNTER — Other Ambulatory Visit: Payer: Self-pay | Admitting: Thoracic Surgery (Cardiothoracic Vascular Surgery)

## 2023-08-22 DIAGNOSIS — R918 Other nonspecific abnormal finding of lung field: Secondary | ICD-10-CM

## 2023-08-28 ENCOUNTER — Ambulatory Visit (HOSPITAL_COMMUNITY)
Admission: RE | Admit: 2023-08-28 | Discharge: 2023-08-28 | Disposition: A | Discharge status: Home / Self Care | Source: Ambulatory Visit | Attending: Cardiovascular Disease | Admitting: Cardiovascular Disease

## 2023-08-28 ENCOUNTER — Encounter: Payer: Self-pay | Admitting: Thoracic Surgery (Cardiothoracic Vascular Surgery)

## 2023-08-28 ENCOUNTER — Ambulatory Visit
Attending: Thoracic Surgery (Cardiothoracic Vascular Surgery) | Admitting: Thoracic Surgery (Cardiothoracic Vascular Surgery)

## 2023-08-28 VITALS — BP 116/73 | HR 100 | Resp 20 | Ht 70.0 in | Wt 129.0 lb

## 2023-08-28 DIAGNOSIS — J439 Emphysema, unspecified: Secondary | ICD-10-CM | POA: Insufficient documentation

## 2023-08-28 DIAGNOSIS — R918 Other nonspecific abnormal finding of lung field: Secondary | ICD-10-CM | POA: Diagnosis present

## 2023-08-28 DIAGNOSIS — Z09 Encounter for follow-up examination after completed treatment for conditions other than malignant neoplasm: Secondary | ICD-10-CM

## 2023-08-28 NOTE — Progress Notes (Signed)
 [address]       [name] [address]             [phone]      HPI: Mr. Mcnab returns for scheduled follow-up.  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   He underwent robotic assisted left lower lobectomy on 06/07/2023.  Had atrial fibrillation postoperatively but converted to sinus rhythm with amiodarone .  I last saw him in the office in July.  He was having issues with falls due to his legs giving out.  He has had numerous spinal surgeries and walks with a cane.  He is having a lot of difficulty breathing due to the heat and humidity.  In the interim since his last visit he has felt better.  His breathing has been better over the past few days as the temperature has moderated.  Not having any significant surgical pain.  At one point was unable to lift his arms above 90 degrees but now that has resolved.  Past Medical History:  Diagnosis Date   Arthritis    Chronic back pain    Chronic neck pain    COPD (chronic obstructive pulmonary disease) (HCC)    S/P cervical spinal fusion    C3-7    Current Outpatient Medications  Medication Sig Dispense Refill   albuterol  (VENTOLIN  HFA) 108 (90 Base) MCG/ACT inhaler Inhale 2 puffs into the lungs every 6 (six) hours as needed for shortness of breath or wheezing.     Ascorbic Acid  (VITAMIN C ) 1000 MG tablet Take 1,000 mg by mouth daily.     baclofen  (LIORESAL ) 10 MG tablet Take 10 mg by mouth daily.     Cholecalciferol  (VITAMIN D3) 50 MCG (2000 UT) capsule Take 2,000 Units by mouth daily.     diazepam  (VALIUM ) 10 MG tablet Take 10 mg by mouth 3 (three) times daily as needed for anxiety.     Fluticasone-Umeclidin-Vilant (TRELEGY ELLIPTA) 100-62.5-25 MCG/ACT AEPB daily at 2 PM.      oxyCODONE -acetaminophen  (PERCOCET) 10-325 MG tablet Take 1 tablet by mouth 4 (four) times daily as needed.     No current facility-administered medications for this visit.    Physical Exam BP 116/73   Pulse 100   Resp 20   Ht 5' 10 (1.778 m)   Wt 129 lb (58.5 kg)   SpO2 93% Comment: RA  BMI 18.50 kg/m  63 year old man in no acute distress Alert and oriented x 3  Tremor right hand greater than left Lungs diminished bilaterally more so at the left base Incisions well-healed Cardiac regular rate and rhythm  Diagnostic Tests: I personally reviewed his chest x-ray images.  Improved wedge-shaped opacity left lower chest.  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   Stage Ia squamous cell carcinoma of the left lower lobe-status post left lower lobectomy.  Will need a CT every 6 months for the first 2 years and then annually after that.  Has not seen an oncologist yet.  I will schedule him to come back and see me in 6 months with a  CT but will ultimately get that taken care of by medical oncology.  Status post left lower lobectomy-doing well at this point.  Has chronic pain issues related to spine and nerve issues.  Still on narcotics for that.  My standpoint there is no contraindication to any spinal intervention that may need to be done.  Plan: Follow-up as scheduled with Dr. Birdena Return in 6 months with CT chest  Elspeth JAYSON Millers, MD [facility] [phone]

## 2023-10-11 ENCOUNTER — Other Ambulatory Visit (HOSPITAL_COMMUNITY): Payer: Self-pay | Admitting: Neurosurgery

## 2023-10-11 DIAGNOSIS — M48062 Spinal stenosis, lumbar region with neurogenic claudication: Secondary | ICD-10-CM

## 2023-10-11 DIAGNOSIS — M5412 Radiculopathy, cervical region: Secondary | ICD-10-CM

## 2023-10-22 ENCOUNTER — Ambulatory Visit (HOSPITAL_COMMUNITY)
Admission: RE | Admit: 2023-10-22 | Discharge: 2023-10-22 | Disposition: A | Discharge status: Home / Self Care | Source: Ambulatory Visit | Attending: Neurosurgery | Admitting: Neurosurgery

## 2023-10-22 DIAGNOSIS — M5412 Radiculopathy, cervical region: Secondary | ICD-10-CM | POA: Diagnosis present

## 2023-10-22 DIAGNOSIS — M48062 Spinal stenosis, lumbar region with neurogenic claudication: Secondary | ICD-10-CM | POA: Insufficient documentation

## 2024-01-14 ENCOUNTER — Other Ambulatory Visit: Payer: Self-pay | Admitting: Thoracic Surgery (Cardiothoracic Vascular Surgery)

## 2024-01-14 DIAGNOSIS — Z09 Encounter for follow-up examination after completed treatment for conditions other than malignant neoplasm: Secondary | ICD-10-CM

## 2024-02-12 ENCOUNTER — Ambulatory Visit (HOSPITAL_COMMUNITY)

## 2024-02-26 ENCOUNTER — Ambulatory Visit: Admitting: Thoracic Surgery (Cardiothoracic Vascular Surgery)
